# Patient Record
Sex: Male | Born: 1970 | Race: White | Hispanic: No | Marital: Married | Smoking: Current every day smoker
Health system: Southern US, Community
[De-identification: ages and names within clinical notes are randomized; demographics above are authoritative.]

## PROBLEM LIST (undated history)

## (undated) DIAGNOSIS — I10 Essential (primary) hypertension: Secondary | ICD-10-CM

## (undated) HISTORY — DX: Essential (primary) hypertension: I10

---

## 1998-02-06 ENCOUNTER — Ambulatory Visit (HOSPITAL_COMMUNITY): Admission: RE | Admit: 1998-02-06 | Discharge: 1998-02-06 | Payer: Self-pay | Admitting: Neurosurgery

## 1998-02-23 ENCOUNTER — Ambulatory Visit (HOSPITAL_COMMUNITY): Admission: RE | Admit: 1998-02-23 | Discharge: 1998-02-23 | Payer: Self-pay | Admitting: Neurosurgery

## 1999-06-10 ENCOUNTER — Encounter: Payer: Self-pay | Admitting: Neurosurgery

## 1999-06-10 ENCOUNTER — Ambulatory Visit (HOSPITAL_COMMUNITY): Admission: RE | Admit: 1999-06-10 | Discharge: 1999-06-10 | Payer: Self-pay | Admitting: Neurosurgery

## 1999-06-22 ENCOUNTER — Encounter: Payer: Self-pay | Admitting: Neurosurgery

## 1999-06-22 ENCOUNTER — Ambulatory Visit (HOSPITAL_COMMUNITY): Admission: RE | Admit: 1999-06-22 | Discharge: 1999-06-22 | Payer: Self-pay | Admitting: Neurosurgery

## 2000-10-06 ENCOUNTER — Ambulatory Visit (HOSPITAL_COMMUNITY): Admission: RE | Admit: 2000-10-06 | Discharge: 2000-10-06 | Payer: Self-pay | Admitting: Neurosurgery

## 2000-10-06 ENCOUNTER — Encounter: Payer: Self-pay | Admitting: Neurosurgery

## 2000-10-20 ENCOUNTER — Ambulatory Visit (HOSPITAL_COMMUNITY): Admission: RE | Admit: 2000-10-20 | Discharge: 2000-10-20 | Payer: Self-pay | Admitting: Neurosurgery

## 2000-10-20 ENCOUNTER — Encounter: Payer: Self-pay | Admitting: Neurosurgery

## 2000-12-01 ENCOUNTER — Ambulatory Visit (HOSPITAL_COMMUNITY): Admission: RE | Admit: 2000-12-01 | Discharge: 2000-12-01 | Payer: Self-pay | Admitting: Neurosurgery

## 2000-12-01 ENCOUNTER — Encounter: Payer: Self-pay | Admitting: Neurosurgery

## 2002-05-16 ENCOUNTER — Encounter: Admission: RE | Admit: 2002-05-16 | Discharge: 2002-05-16 | Payer: Self-pay | Admitting: Neurosurgery

## 2002-05-16 ENCOUNTER — Encounter: Payer: Self-pay | Admitting: Neurosurgery

## 2002-06-10 ENCOUNTER — Encounter: Payer: Self-pay | Admitting: Neurosurgery

## 2002-06-10 ENCOUNTER — Encounter: Admission: RE | Admit: 2002-06-10 | Discharge: 2002-06-10 | Payer: Self-pay | Admitting: Neurosurgery

## 2002-06-25 ENCOUNTER — Encounter: Payer: Self-pay | Admitting: Neurosurgery

## 2002-06-25 ENCOUNTER — Encounter: Admission: RE | Admit: 2002-06-25 | Discharge: 2002-06-25 | Payer: Self-pay | Admitting: Neurosurgery

## 2003-04-18 ENCOUNTER — Encounter: Admission: RE | Admit: 2003-04-18 | Discharge: 2003-04-18 | Payer: Self-pay | Admitting: Neurosurgery

## 2003-04-18 ENCOUNTER — Encounter: Payer: Self-pay | Admitting: Neurosurgery

## 2003-05-30 ENCOUNTER — Encounter: Payer: Self-pay | Admitting: Diagnostic Radiology

## 2003-05-30 ENCOUNTER — Encounter: Payer: Self-pay | Admitting: Neurosurgery

## 2003-05-30 ENCOUNTER — Encounter: Admission: RE | Admit: 2003-05-30 | Discharge: 2003-05-30 | Payer: Self-pay | Admitting: Neurosurgery

## 2003-06-16 ENCOUNTER — Encounter: Admission: RE | Admit: 2003-06-16 | Discharge: 2003-06-16 | Payer: Self-pay | Admitting: Neurosurgery

## 2003-06-16 ENCOUNTER — Encounter: Payer: Self-pay | Admitting: Neurosurgery

## 2003-10-02 ENCOUNTER — Encounter: Admission: RE | Admit: 2003-10-02 | Discharge: 2003-10-02 | Payer: Self-pay | Admitting: Neurosurgery

## 2003-10-15 ENCOUNTER — Encounter: Admission: RE | Admit: 2003-10-15 | Discharge: 2003-10-15 | Payer: Self-pay | Admitting: Neurosurgery

## 2004-04-30 ENCOUNTER — Encounter: Admission: RE | Admit: 2004-04-30 | Discharge: 2004-04-30 | Payer: Self-pay | Admitting: Neurosurgery

## 2004-05-20 ENCOUNTER — Encounter: Admission: RE | Admit: 2004-05-20 | Discharge: 2004-05-20 | Payer: Self-pay | Admitting: Neurosurgery

## 2004-06-07 ENCOUNTER — Encounter: Admission: RE | Admit: 2004-06-07 | Discharge: 2004-06-07 | Payer: Self-pay | Admitting: Neurosurgery

## 2004-07-22 ENCOUNTER — Encounter: Admission: RE | Admit: 2004-07-22 | Discharge: 2004-07-22 | Payer: Self-pay | Admitting: Family Medicine

## 2006-05-29 ENCOUNTER — Encounter: Admission: RE | Admit: 2006-05-29 | Discharge: 2006-05-29 | Payer: Self-pay | Admitting: Neurosurgery

## 2007-12-21 ENCOUNTER — Encounter: Admission: RE | Admit: 2007-12-21 | Discharge: 2007-12-21 | Payer: Self-pay | Admitting: Family Medicine

## 2007-12-26 ENCOUNTER — Ambulatory Visit (HOSPITAL_COMMUNITY): Admission: RE | Admit: 2007-12-26 | Discharge: 2007-12-26 | Payer: Self-pay | Admitting: Family Medicine

## 2008-01-11 ENCOUNTER — Ambulatory Visit: Payer: Self-pay | Admitting: Vascular Surgery

## 2009-12-20 ENCOUNTER — Encounter: Admission: RE | Admit: 2009-12-20 | Discharge: 2009-12-20 | Payer: Self-pay | Admitting: Neurosurgery

## 2010-03-05 ENCOUNTER — Encounter: Admission: RE | Admit: 2010-03-05 | Discharge: 2010-03-05 | Payer: Self-pay | Admitting: Neurosurgery

## 2010-03-12 ENCOUNTER — Inpatient Hospital Stay (HOSPITAL_COMMUNITY): Admission: RE | Admit: 2010-03-12 | Discharge: 2010-03-13 | Payer: Self-pay | Admitting: Neurosurgery

## 2010-11-21 ENCOUNTER — Encounter: Payer: Self-pay | Admitting: Neurosurgery

## 2011-01-18 LAB — COMPREHENSIVE METABOLIC PANEL
Alkaline Phosphatase: 52 U/L (ref 39–117)
BUN: 14 mg/dL (ref 6–23)
CO2: 26 mEq/L (ref 19–32)
Calcium: 9.4 mg/dL (ref 8.4–10.5)
Chloride: 107 mEq/L (ref 96–112)
Creatinine, Ser: 1.22 mg/dL (ref 0.4–1.5)
Sodium: 139 mEq/L (ref 135–145)
Total Protein: 6.6 g/dL (ref 6.0–8.3)

## 2011-01-18 LAB — URINALYSIS, ROUTINE W REFLEX MICROSCOPIC
Leukocytes, UA: NEGATIVE
Nitrite: NEGATIVE
Protein, ur: NEGATIVE mg/dL
Specific Gravity, Urine: 1.034 — ABNORMAL HIGH (ref 1.005–1.030)
Urobilinogen, UA: 1 mg/dL (ref 0.0–1.0)

## 2011-01-18 LAB — DIFFERENTIAL
Basophils Absolute: 0 10*3/uL (ref 0.0–0.1)
Basophils Relative: 0 % (ref 0–1)
Lymphocytes Relative: 27 % (ref 12–46)
Lymphs Abs: 2.2 10*3/uL (ref 0.7–4.0)
Monocytes Absolute: 0.6 10*3/uL (ref 0.1–1.0)
Neutro Abs: 5.1 10*3/uL (ref 1.7–7.7)

## 2011-01-18 LAB — CBC
HCT: 42.1 % (ref 39.0–52.0)
MCHC: 34.7 g/dL (ref 30.0–36.0)
RDW: 13.2 % (ref 11.5–15.5)

## 2011-01-18 LAB — URINE MICROSCOPIC-ADD ON

## 2011-01-18 LAB — SURGICAL PCR SCREEN: MRSA, PCR: NEGATIVE

## 2011-01-18 LAB — PROTIME-INR: INR: 0.97 (ref 0.00–1.49)

## 2011-01-18 LAB — APTT: aPTT: 28 seconds (ref 24–37)

## 2011-03-15 NOTE — Consult Note (Signed)
NEW PATIENT CONSULTATION   Anthony Cowan, Anthony Cowan  DOB:  05-Feb-1971                                       01/11/2008  XLKGM#:[id number]   HISTORY:  The patient is a 41 year old healthy gentleman who noted right  upper arm swelling over the past several weeks.  He is right-handed and  works as a Nutritional therapist.  He underwent initially an ultrasound for evaluation  of this which showed dampened flow in the subclavian vein.  Therefore  underwent a venogram on December 26, 2007, which I have reviewed.  His  venogram shows significant narrowing at the thoracic outlet.  There was  no evidence of thrombus.  There was an attempted angioplasty of this and  this did appear to be a fixed lesion with poor result from angioplasty.  The patient does not have any specific complaints of what would be  considered neurogenic thoracic outlet syndrome.  Specifically no  weakness or paresthesias.  He denies any prior trauma to this.   PAST MEDICAL HISTORY:  His past medical history is significant only for  elevated cholesterol.   FAMILY HISTORY:  Family history is for a heart attack in a brother at  age 54.  Sister died at age 72 with acute cardiomyopathy.   SOCIAL HISTORY:  He is married with 2 children.  He does work as a  Warehouse manager.  He does not smoke, having quit in 1996.  He does  not drink alcohol on a regular basis.  His weight is 220 pounds and he  is 6 feet tall.   REVIEW OF SYSTEMS:  Otherwise totally unremarkable.   ALLERGIES:  None.   MEDICATIONS:  1. Aspirin 81 mg daily.  2. Hydrocodone for pain as needed.   PHYSICAL EXAMINATION:  A well-developed, well-nourished, white male  appearing stated age.  He does not have any appreciable swelling in his  right versus left arm today.  On raising his arm above his head he does  lose his right radial pulse, does not lose his left radial pulse.   I had a long discussion with the patient and his wife present.  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  I  discussed the options from observation only to first rib resection and  subsequent venous angioplasty versus first rib resection and primary  venous repair.  They will discuss this further.  Currently he will  remain on his aspirin therapy.   Larina Earthly, M.D.  Electronically Signed   TFE/MEDQ  D:  01/14/2008  T:  01/14/2008  Job:  [id number]

## 2012-10-31 HISTORY — PX: CERVICAL DISC SURGERY: SHX588

## 2014-07-17 ENCOUNTER — Other Ambulatory Visit: Payer: Self-pay | Admitting: Family Medicine

## 2014-07-17 DIAGNOSIS — M542 Cervicalgia: Secondary | ICD-10-CM

## 2014-07-29 ENCOUNTER — Other Ambulatory Visit: Payer: Self-pay

## 2014-11-07 ENCOUNTER — Other Ambulatory Visit: Payer: Self-pay | Admitting: Neurosurgery

## 2014-11-07 DIAGNOSIS — M47812 Spondylosis without myelopathy or radiculopathy, cervical region: Secondary | ICD-10-CM

## 2014-11-10 ENCOUNTER — Ambulatory Visit
Admission: RE | Admit: 2014-11-10 | Discharge: 2014-11-10 | Disposition: A | Discharge status: Home / Self Care | Payer: PRIVATE HEALTH INSURANCE | Source: Ambulatory Visit | Attending: Neurosurgery | Admitting: Neurosurgery

## 2014-11-10 ENCOUNTER — Other Ambulatory Visit: Payer: Self-pay

## 2014-11-10 DIAGNOSIS — M47812 Spondylosis without myelopathy or radiculopathy, cervical region: Secondary | ICD-10-CM

## 2014-11-11 ENCOUNTER — Other Ambulatory Visit: Payer: Self-pay

## 2016-07-21 IMAGING — MR MR CERVICAL SPINE W/O CM
5 series · 30 of 48 positions shown · non-contrast
Comparison: 03/04/2010 lateral plain film exam of the cervical
spine. 12/20/2009 preoperative MR.

CLINICAL DATA: 43-year-old male with chronic neck pain. No injury.
Fusion 6433. Improvement after surgery but always has neck pain.
Evaluate for adjacent disease. Initial encounter.

EXAM:
MRI CERVICAL SPINE WITHOUT CONTRAST
TECHNIQUE: Multiplanar, multisequence MR imaging of the cervical spine was
performed. No intravenous contrast was administered.

[Series 3: T2 · sagittal · 3.0mm · 0.66mm/px · 6 of 12 slices shown (1 of 2)]
[im 1/12]
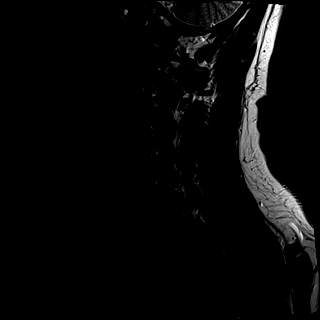
[im 3/12]
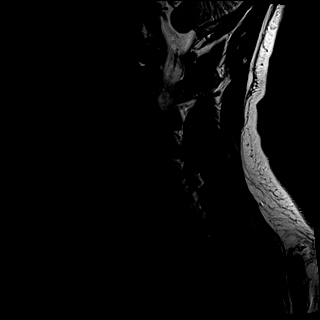
[im 5/12]
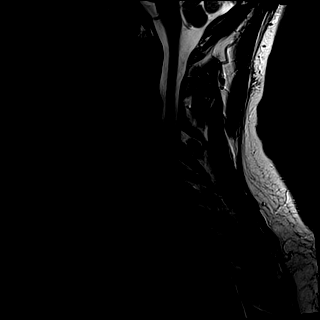
[im 7/12]
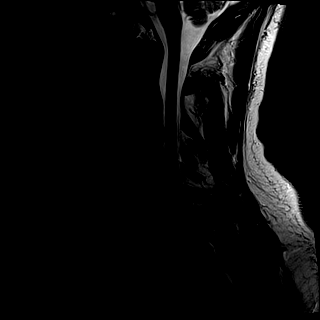
[im 9/12]
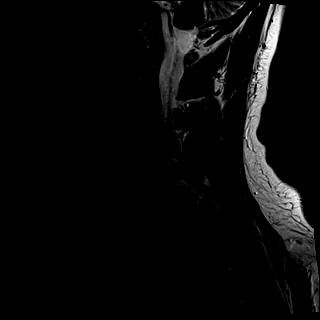
[im 12/12]
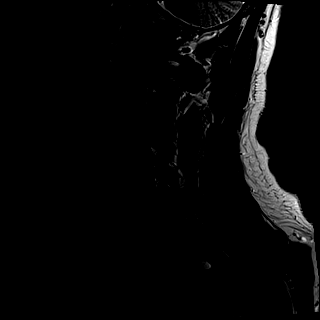

[Series 4: T1 · sagittal · 3.0mm · 0.66mm/px · 6 of 12 slices shown]
[im 1/12]
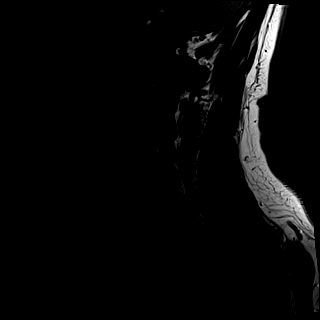
[im 3/12]
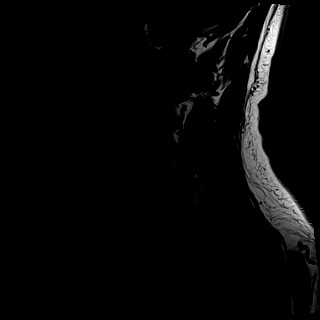
[im 5/12]
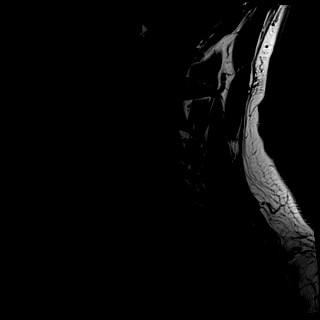
[im 7/12]
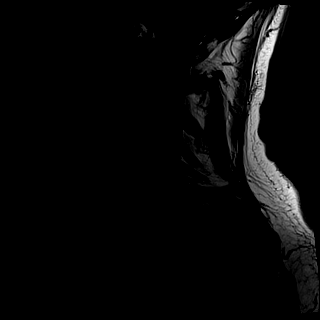
[im 9/12]
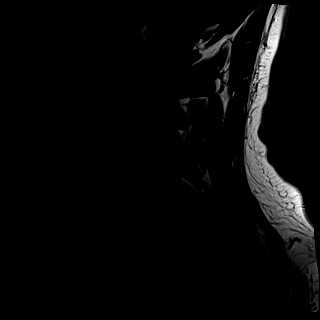
[im 12/12]
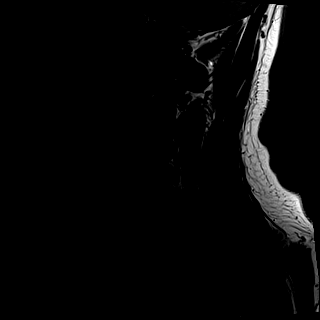

[Series 5: T2 · axial · 3.0mm · 0.56mm/px · z∈[-82,+34]mm · 9 of 32 slices shown (2 of 2)]
[im 1/32]
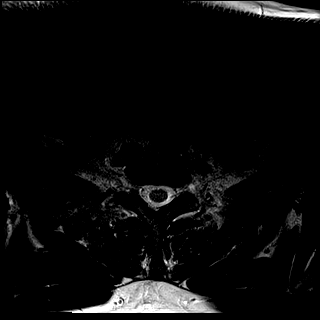
[im 5/32]
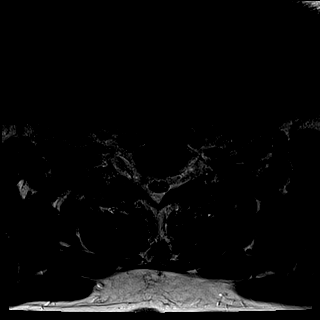
[im 9/32]
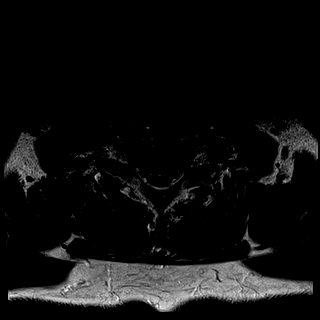
[im 14/32]
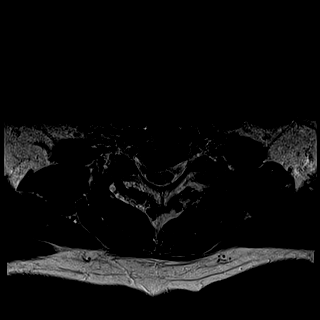
[im 16/32]
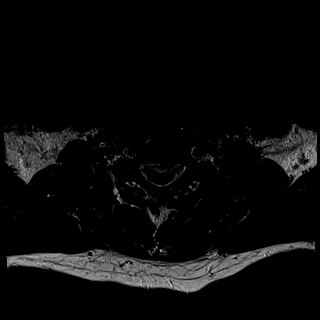
[im 18/32]
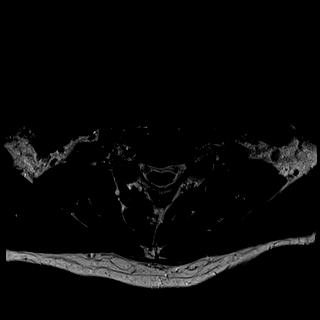
[im 23/32]
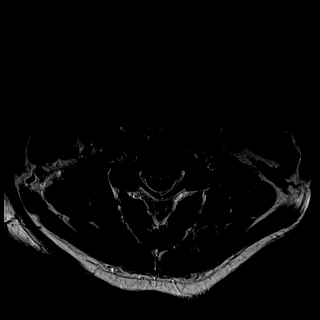
[im 27/32]
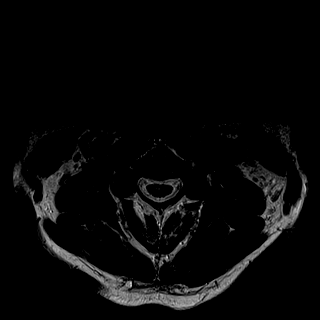
[im 32/32]
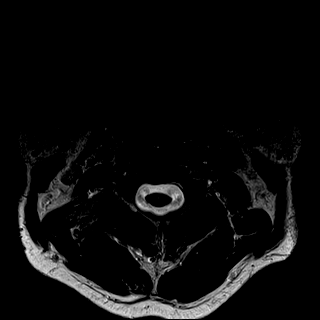

[Series 6: tir sag · sagittal · 3.0mm · 0.41mm/px · 6 of 12 slices shown]
[im 1/12]
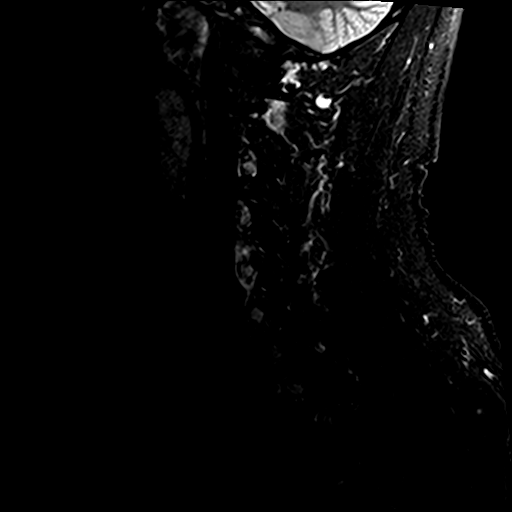
[im 3/12]
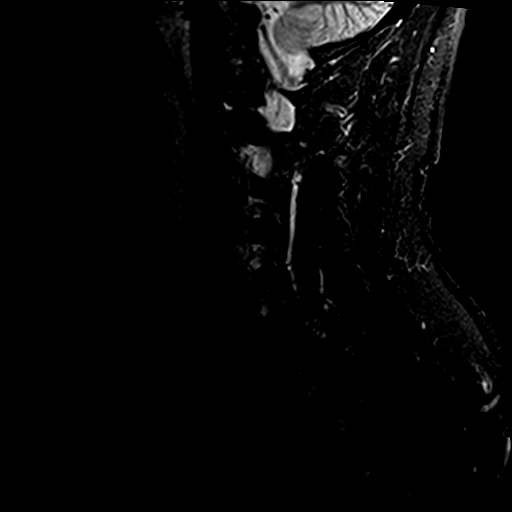
[im 5/12]
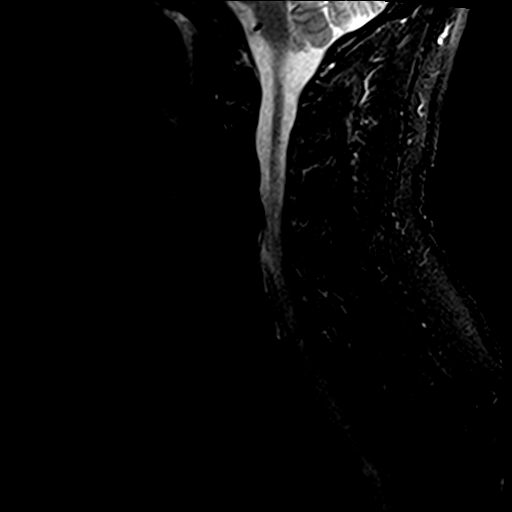
[im 7/12]
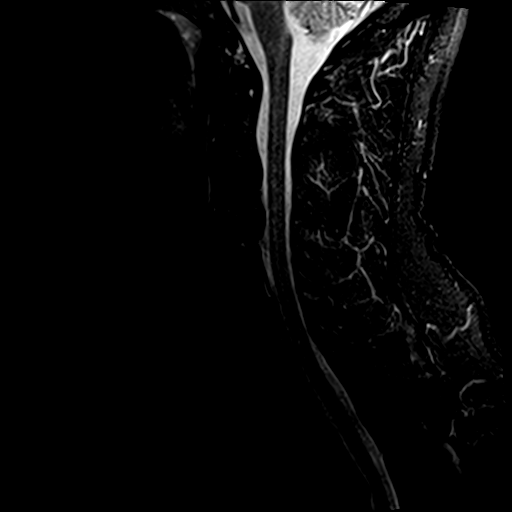
[im 9/12]
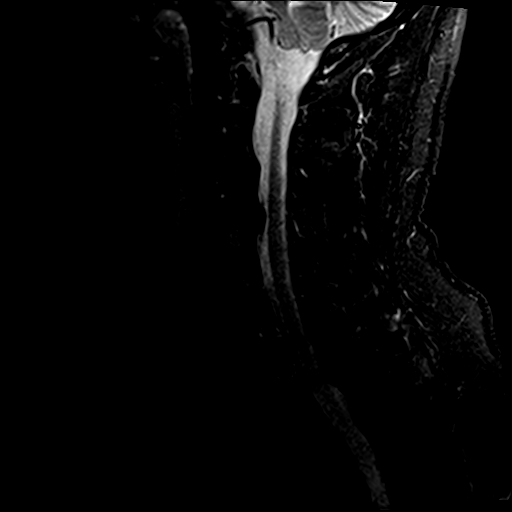
[im 12/12]
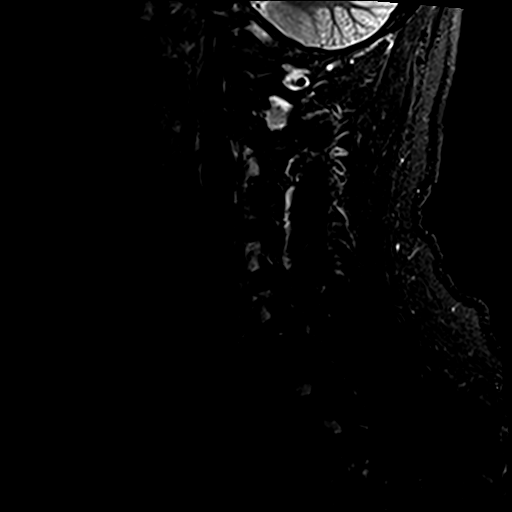

[Series 7: GRE · axial · 3.0mm · 0.35mm/px · z∈[-82,-52]mm · 3 of 32 slices shown]
[im 1/32]
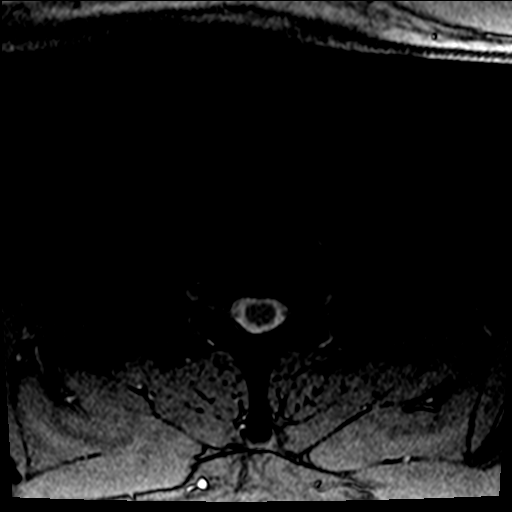
[im 5/32]
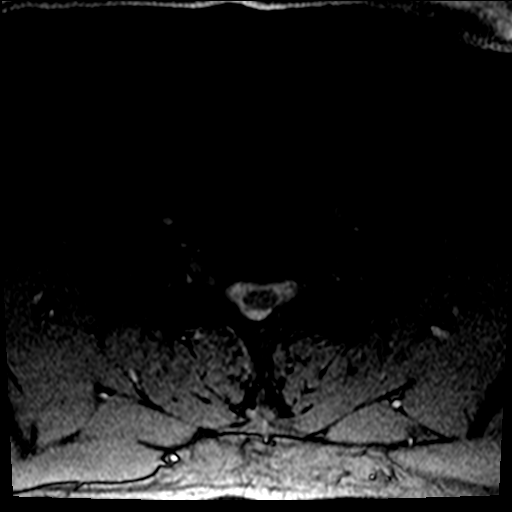
[im 9/32]
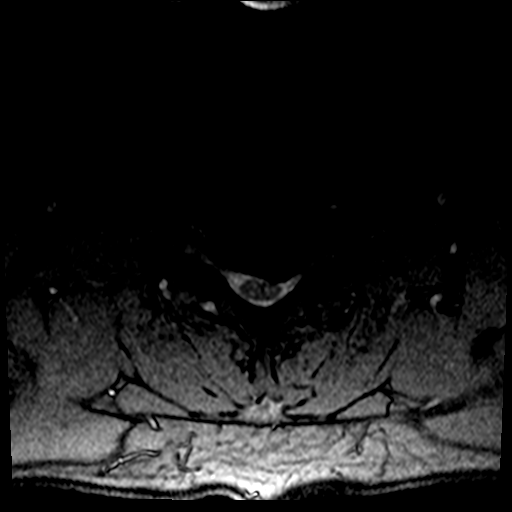

[30 of 48 positions shown; findings below may reference images not displayed]

FINDINGS: Cervical medullary junction and visualized intracranial structures
unremarkable. No focal cervical cord signal abnormality. Visualized
paravertebral structures within normal limits.

C2-3:  Negative.

C3-4: Small Schmorl's node deformity inferior endplate C3. Mild
bulge/ osteophyte. Narrowing of the ventral aspect of the thecal sac
without cord compression. Uncinate hypertrophy. Mild bilateral
foraminal narrowing.

C4-5:  Prior fusion.  Small spur right paracentral position.

C5-6: Prior fusion. Spur greater to the right. Impression upon the
ventral aspect of the thecal sac greater on right with minimal
right-sided cord contact/ flattening.

C6-7: Prior fusion. Osteophyte with greatest extension left
paracentral/posterior lateral position. Impression upon the ventral
aspect of the thecal sac greater on left with mild left-sided cord
contact/ flattening. Uncinate hypertrophy with moderate right-sided
and moderate to marked left-sided foraminal narrowing.

C7-T1: Facet joint degenerative changes greater on the left. Minimal
impression left posterior lateral aspect of the thecal sac.

T1-2: Mild posterior ligament prominence with mild narrowing dorsal
aspect of thecal sac. Axial images not obtained.

T2-3: Bulge greatest centrally with narrowing of the ventral aspect
of the thecal sac and mild cord flattening. This has progressed
minimally since prior exam. Axial images not obtained.
IMPRESSION: Since the prior MR, interval anterior discectomy/fusion C4 through
C7.

Summary of pertinent findings includes:

C3-4 mild bulge/ osteophyte. Narrowing of the ventral aspect of the
thecal sac. Mild bilateral foraminal narrowing.

C4-5 prior fusion. Small spur right paracentral position.

C5-6 prior fusion. Spur greater to the right. Impression upon the
ventral aspect of the thecal sac greater on right with minimal
right-sided cord contact/ flattening.

C6-7 prior fusion. Osteophyte with greatest extension left
paracentral/posterior lateral position. Impression upon the ventral
aspect of the thecal sac greater on left with mild left-sided cord
contact/ flattening. Moderate right-sided and moderate to marked
left-sided foraminal narrowing.

C7-T1 facet joint degenerative changes greater on the left. Minimal
impression left posterior lateral aspect of the thecal sac.

T1-2 mild posterior ligament prominence with mild narrowing dorsal
aspect of thecal sac. Axial images not obtained.

T2-3 bulge greatest centrally with narrowing of the ventral aspect
of the thecal sac and mild cord flattening. This has progressed
minimally since prior exam. Axial images not obtained.

## 2017-09-12 DIAGNOSIS — M47812 Spondylosis without myelopathy or radiculopathy, cervical region: Secondary | ICD-10-CM

## 2017-09-12 DIAGNOSIS — I10 Essential (primary) hypertension: Secondary | ICD-10-CM | POA: Insufficient documentation

## 2017-09-12 HISTORY — DX: Spondylosis without myelopathy or radiculopathy, cervical region: M47.812

## 2019-09-17 ENCOUNTER — Other Ambulatory Visit: Payer: Self-pay

## 2019-09-17 ENCOUNTER — Encounter: Payer: Self-pay | Admitting: Cardiology

## 2019-09-17 ENCOUNTER — Ambulatory Visit (INDEPENDENT_AMBULATORY_CARE_PROVIDER_SITE_OTHER): Payer: PRIVATE HEALTH INSURANCE | Admitting: Cardiology

## 2019-09-17 VITALS — BP 176/98 | HR 76 | Ht 72.0 in | Wt 214.0 lb

## 2019-09-17 DIAGNOSIS — I10 Essential (primary) hypertension: Secondary | ICD-10-CM | POA: Diagnosis not present

## 2019-09-17 MED ORDER — LISINOPRIL 20 MG PO TABS: 20.0000 mg | ORAL_TABLET | Freq: Every day | ORAL | 1 refills | Status: DC

## 2019-09-17 MED ORDER — AMLODIPINE BESYLATE 10 MG PO TABS: 10.0000 mg | ORAL_TABLET | Freq: Every day | ORAL | 1 refills | Status: DC

## 2019-09-17 NOTE — Progress Notes (Signed)
Cardiology Office Note:    Date:  09/17/2019   ID:  Anthony Cowan, DOB Aug 22, 1971, MRN [id number]  PCP:  No primary care provider on file.  Cardiologist:  No primary care provider on file.  Electrophysiologist:  None   Referring MD: No ref. provider found   Chief Complaint  Patient presents with  . New Patient (Initial Visit)   History of Present Illness:    Anthony Cowan is a 48 y.o. male with a hx of hypertension diagnosed several years ago he has been taking lisinopril 20 mg daily along with amlodipine 10 mg daily.  He is here today to establish cardiac care.  He denies any chest pain, shortness of breath, nausea, vomiting, any dizziness or nausea.  Past Medical History:  Diagnosis Date  . Hypertension     Past Surgical History:  Procedure Laterality Date  . CERVICAL DISC SURGERY  2014   C 1-3 Fusion    Current Medications: Current Meds  Medication Sig  . HYDROcodone-acetaminophen (NORCO/VICODIN) 5-325 MG tablet Take by mouth.     Allergies:   Patient has no known allergies.   Social History   Socioeconomic History  . Marital status: Married    Spouse name: Not on file  . Number of children: Not on file  . Years of education: Not on file  . Highest education level: Not on file  Occupational History  . Not on file  Social Needs  . Financial resource strain: Not on file  . Food insecurity    Worry: Not on file    Inability: Not on file  . Transportation needs    Medical: Not on file    Non-medical: Not on file  Tobacco Use  . Smoking status: Current Every Day Smoker    Types: Cigars  . Smokeless tobacco: Never Used  . Tobacco comment: 3 cigars/day  Substance and Sexual Activity  . Alcohol use: Not on file  . Drug use: Not on file  . Sexual activity: Not on file  Lifestyle  . Physical activity    Days per week: Not on file    Minutes per session: Not on file  . Stress: Not on file  Relationships  . Social Herbalist on phone: Not on  file    Gets together: Not on file    Attends religious service: Not on file    Active member of club or organization: Not on file    Attends meetings of clubs or organizations: Not on file    Relationship status: Not on file  Other Topics Concern  . Not on file  Social History Narrative  . Not on file     Family History: The patient's family history includes Heart attack in his sister; Hypertension in his brother and mother.  ROS:   Review of Systems  Constitution: Negative for decreased appetite, fever and weight gain.  HENT: Negative for congestion, ear discharge, hoarse voice and sore throat.   Eyes: Negative for discharge, redness, vision loss in right eye and visual halos.  Cardiovascular: Negative for chest pain, dyspnea on exertion, leg swelling, orthopnea and palpitations.  Respiratory: Negative for cough, hemoptysis, shortness of breath and snoring.   Endocrine: Negative for heat intolerance and polyphagia.  Hematologic/Lymphatic: Negative for bleeding problem. Does not bruise/bleed easily.  Skin: Negative for flushing, nail changes, rash and suspicious lesions.  Musculoskeletal: Negative for arthritis, joint pain, muscle cramps, myalgias, neck pain and stiffness.  Gastrointestinal: Negative for abdominal  pain, bowel incontinence, diarrhea and excessive appetite.  Genitourinary: Negative for decreased libido, genital sores and incomplete emptying.  Neurological: Negative for brief paralysis, focal weakness, headaches and loss of balance.  Psychiatric/Behavioral: Negative for altered mental status, depression and suicidal ideas.  Allergic/Immunologic: Negative for HIV exposure and persistent infections.    EKGs/Labs/Other Studies Reviewed:    The following studies were reviewed today:   EKG:  The ekg ordered today demonstrates sinus rhythm, heart rate 74 bpm, no prior EKG to compare.  Recent Labs: No results found for requested labs within last 8760 hours.  Recent  Lipid Panel No results found for: CHOL, TRIG, HDL, CHOLHDL, VLDL, LDLCALC, LDLDIRECT  Physical Exam:    VS:  BP (!) 176/98 (BP Location: Left Arm, Patient Position: Sitting)   Pulse 76   Ht 6' (1.829 m)   Wt 214 lb (97.1 kg)   SpO2 96%   BMI 29.02 kg/m     Wt Readings from Last 3 Encounters:  09/17/19 214 lb (97.1 kg)     GEN: Well nourished, well developed in no acute distress HEENT: Normal NECK: No JVD; No carotid bruits LYMPHATICS: No lymphadenopathy CARDIAC: S1S2 noted,RRR, no murmurs, rubs, gallops RESPIRATORY:  Clear to auscultation without rales, wheezing or rhonchi  ABDOMEN: Soft, non-tender, non-distended, +bowel sounds, no guarding. EXTREMITIES: No edema, No cyanosis, no clubbing MUSCULOSKELETAL:  No edema; No deformity  SKIN: Warm and dry NEUROLOGIC:  Alert and oriented x 3, non-focal PSYCHIATRIC:  Normal affect, good insight  ASSESSMENT:    No diagnosis found. PLAN:    1.  He is hypertensive today in the office however he had not been able to take his lisinopril 20 mg daily and amlodipine 10 mg daily due to him running out and his previous physician will not refill these medications.  I will restart him on his lisinopril 20 mg daily, amlodipine 10 mg daily.  I have also educated patient on decreasing salt intake.  In addition he is going to be getting the pressure cuff where he will take his blood pressure daily as this will help me understand if his current dose is meeting a target of 130/80 mmHg.  A transthoracic echocardiogram has been ordered today to assess any RV/LV function and any structural abnormalities.  2.  Blood work will be performed today.  Includes magnesium, BMP and lipid profile.  The patient is in agreement with the above plan. The patient left the office in stable condition.  The patient will follow up in 3 months   Medication Adjustments/Labs and Tests Ordered: Current medicines are reviewed at length with the patient today.  Concerns  regarding medicines are outlined above.  No orders of the defined types were placed in this encounter.  No orders of the defined types were placed in this encounter.   There are no Patient Instructions on file for this visit.   Adopting a Healthy Lifestyle.  Know what a healthy weight is for you (roughly BMI <25) and aim to maintain this   Aim for 7+ servings of fruits and vegetables daily   65-80+ fluid ounces of water or unsweet tea for healthy kidneys   Limit to max 1 drink of alcohol per day; avoid smoking/tobacco   Limit animal fats in diet for cholesterol and heart health - choose grass fed whenever available   Avoid highly processed foods, and foods high in saturated/trans fats   Aim for low stress - take time to unwind and care for your mental health  Aim for 150 min of moderate intensity exercise weekly for heart health, and weights twice weekly for bone health   Aim for 7-9 hours of sleep daily   When it comes to diets, agreement about the perfect plan isnt easy to find, even among the experts. Experts at the [facility] [employer] developed an idea known as the Healthy Eating Plate. Just imagine a plate divided into logical, healthy portions.   The emphasis is on diet quality:   Load up on vegetables and fruits - one-half of your plate: Aim for color and variety, and remember that potatoes dont count.   Go for whole grains - one-quarter of your plate: Whole wheat, barley, wheat berries, quinoa, oats, brown rice, and foods made with them. If you want pasta, go with whole wheat pasta.   Protein power - one-quarter of your plate: Fish, chicken, beans, and nuts are all healthy, versatile protein sources. Limit red meat.   The diet, however, does go beyond the plate, offering a few other suggestions.   Use healthy plant oils, such as olive, canola, soy, corn, sunflower and peanut. Check the labels, and avoid partially hydrogenated oil, which have unhealthy  trans fats.   If youre thirsty, drink water. Coffee and tea are good in moderation, but skip sugary drinks and limit milk and dairy products to one or two daily servings.   The type of carbohydrate in the diet is more important than the amount. Some sources of carbohydrates, such as vegetables, fruits, whole grains, and beans-are healthier than others.   Finally, stay active  Signed, Thomasene RippleKardie Samreet Edenfield, DO  09/17/2019 9:38 AM    [facility]

## 2019-09-17 NOTE — Patient Instructions (Addendum)
Medication Instructions:  Your physician has recommended you make the following change in your medication:   STArt Lisinopril 20 mg Take 1 tab daily START: Amlodipine 10 mg Take 1 tab daily   *If you need a refill on your cardiac medications before your next appointment, please call your pharmacy*  Lab Work: Your physician recommends that you return for lab work today: BMET Magnesium Lipids  If you have labs (blood work) drawn today and your tests are completely normal, you will receive your results only by: Marland Kitchen MyChart Message (if you have MyChart) OR . A paper copy in the mail If you have any lab test that is abnormal or we need to change your treatment, we will call you to review the results.  Testing/Procedures: Echocardiogram  Follow-Up: At [facility], you and your health needs are our priority.  As part of our continuing mission to provide you with exceptional heart care, we have created designated Provider Care Teams.  These Care Teams include your primary Cardiologist (physician) and Advanced Practice Providers (APPs -  Physician Assistants and Nurse Practitioners) who all work together to provide you with the care you need, when you need it.  Your next appointment:   3 months  The format for your next appointment:   In person  Provider:   Dr. Servando Salina  Other Instructions  Echocardiogram An echocardiogram is a procedure that uses painless sound waves (ultrasound) to produce an image of the heart. Images from an echocardiogram can provide important information about:  Signs of coronary artery disease (CAD).  Aneurysm detection. An aneurysm is a weak or damaged part of an artery wall that bulges out from the normal force of blood pumping through the body.  Heart size and shape. Changes in the size or shape of the heart can be associated with certain conditions, including heart failure, aneurysm, and CAD.  Heart muscle function.  Heart valve function.  Signs of a  past heart attack.  Fluid buildup around the heart.  Thickening of the heart muscle.  A tumor or infectious growth around the heart valves. Tell a health care provider about:  Any allergies you have.  All medicines you are taking, including vitamins, herbs, eye drops, creams, and over-the-counter medicines.  Any blood disorders you have.  Any surgeries you have had.  Any medical conditions you have.  Whether you are pregnant or may be pregnant. What are the risks? Generally, this is a safe procedure. However, problems may occur, including:  Allergic reaction to dye (contrast) that may be used during the procedure. What happens before the procedure? No specific preparation is needed. You may eat and drink normally. What happens during the procedure?   An IV tube may be inserted into one of your veins.  You may receive contrast through this tube. A contrast is an injection that improves the quality of the pictures from your heart.  A gel will be applied to your chest.  A wand-like tool (transducer) will be moved over your chest. The gel will help to transmit the sound waves from the transducer.  The sound waves will harmlessly bounce off of your heart to allow the heart images to be captured in real-time motion. The images will be recorded on a computer. The procedure may vary among health care providers and hospitals. What happens after the procedure?  You may return to your normal, everyday life, including diet, activities, and medicines, unless your health care provider tells you not to do that. Summary  An echocardiogram is a procedure that uses painless sound waves (ultrasound) to produce an image of the heart.  Images from an echocardiogram can provide important information about the size and shape of your heart, heart muscle function, heart valve function, and fluid buildup around your heart.  You do not need to do anything to prepare before this procedure. You  may eat and drink normally.  After the echocardiogram is completed, you may return to your normal, everyday life, unless your health care provider tells you not to do that. This information is not intended to replace advice given to you by your health care provider. Make sure you discuss any questions you have with your health care provider. Document Released: 10/14/2000 Document Revised: 02/07/2019 Document Reviewed: 11/19/2016 Elsevier Patient Education  2020 [employer].

## 2019-09-18 LAB — BASIC METABOLIC PANEL
BUN/Creatinine Ratio: 10 (ref 9–20)
BUN: 12 mg/dL (ref 6–24)
CO2: 24 mmol/L (ref 20–29)
Calcium: 9.7 mg/dL (ref 8.7–10.2)
Chloride: 104 mmol/L (ref 96–106)
Creatinine, Ser: 1.16 mg/dL (ref 0.76–1.27)
GFR calc Af Amer: 86 mL/min/{1.73_m2} (ref >59)
GFR calc non Af Amer: 74 mL/min/{1.73_m2} (ref >59)
Glucose: 92 mg/dL (ref 65–99)
Potassium: 4.7 mmol/L (ref 3.5–5.2)
Sodium: 142 mmol/L (ref 134–144)

## 2019-09-18 LAB — LIPID PANEL
Chol/HDL Ratio: 6.8 ratio — ABNORMAL HIGH (ref 0.0–5.0)
Cholesterol, Total: 177 mg/dL (ref 100–199)
HDL: 26 mg/dL — ABNORMAL LOW (ref >39)
LDL Chol Calc (NIH): 128 mg/dL — ABNORMAL HIGH (ref 0–99)
Triglycerides: 124 mg/dL (ref 0–149)
VLDL Cholesterol Cal: 23 mg/dL (ref 5–40)

## 2019-09-18 LAB — MAGNESIUM: Magnesium: 2.2 mg/dL (ref 1.6–2.3)

## 2019-09-19 ENCOUNTER — Telehealth: Payer: Self-pay | Admitting: *Deleted

## 2019-09-19 NOTE — Telephone Encounter (Signed)
Left message that labs were normal per DR tobb and to call with any questions

## 2019-09-19 NOTE — Telephone Encounter (Signed)
-----   Message from Berniece Salines, DO sent at 09/18/2019  9:17 PM EST ----- Normal labs

## 2019-11-04 ENCOUNTER — Other Ambulatory Visit: Payer: Self-pay

## 2019-11-04 ENCOUNTER — Ambulatory Visit (INDEPENDENT_AMBULATORY_CARE_PROVIDER_SITE_OTHER): Payer: Managed Care, Other (non HMO)

## 2019-11-04 DIAGNOSIS — I10 Essential (primary) hypertension: Secondary | ICD-10-CM

## 2019-11-04 NOTE — Progress Notes (Signed)
Complete echocardiogram has been performed.  Jimmy Deborrah Mabin RDCS, RVT 

## 2019-11-07 ENCOUNTER — Telehealth: Payer: Self-pay | Admitting: *Deleted

## 2019-11-07 NOTE — Telephone Encounter (Signed)
Telephone call to patient. Left message with echo results and to call with any questions. 

## 2019-11-07 NOTE — Telephone Encounter (Signed)
-----   Message from Thomasene Ripple, DO sent at 11/05/2019  7:20 PM EST ----- The echo showed that the heart is not fully relaxing like it should ( diastolic dysfunction) ,but otherwise normal. I will discuss it at the next office visit.

## 2019-11-12 ENCOUNTER — Other Ambulatory Visit: Payer: Self-pay | Admitting: Cardiology

## 2019-12-18 ENCOUNTER — Encounter: Payer: Self-pay | Admitting: Cardiology

## 2019-12-18 ENCOUNTER — Ambulatory Visit (INDEPENDENT_AMBULATORY_CARE_PROVIDER_SITE_OTHER): Payer: Managed Care, Other (non HMO) | Admitting: Cardiology

## 2019-12-18 ENCOUNTER — Other Ambulatory Visit: Payer: Self-pay

## 2019-12-18 VITALS — BP 152/102 | HR 79 | Ht 72.0 in | Wt 224.0 lb

## 2019-12-18 DIAGNOSIS — I10 Essential (primary) hypertension: Secondary | ICD-10-CM

## 2019-12-18 DIAGNOSIS — E785 Hyperlipidemia, unspecified: Secondary | ICD-10-CM

## 2019-12-18 DIAGNOSIS — E669 Obesity, unspecified: Secondary | ICD-10-CM

## 2019-12-18 HISTORY — DX: Hyperlipidemia, unspecified: E78.5

## 2019-12-18 HISTORY — DX: Obesity, unspecified: E66.9

## 2019-12-18 MED ORDER — LISINOPRIL 20 MG PO TABS: 20.0000 mg | ORAL_TABLET | Freq: Two times a day (BID) | ORAL | 1 refills | Status: DC

## 2019-12-18 NOTE — Progress Notes (Signed)
Cardiology Office Note:    Date:  12/18/2019   ID:  Nanda Quinton, DOB [birth year], MRN [id number]  PCP:  Patient, No Pcp Per  Cardiologist:  Thomasene Ripple, DO  Electrophysiologist:  None   Referring MD: No ref. provider found   Chief Complaint  Patient presents with  . Follow-up    History of Present Illness:    JONATHANDAVID MARLETT is a 49 y.o. male with a hx of hypertension presents today for follow-up visit.  I did see the patient for the first time on September 17, 2019 at that time he was prescribed lisinopril 20 mg daily and amlodipine 10 mg daily. At the time of his visit he was not taking his antihypertensive medication due to him running out.  At the conclusion of his visit I restarted his medications,asked the patient take his blood pressure daily and educated him about salt intake. A TTE was also ordered to assess LV wall thickness.  Patient is here today for follow-up visit.  No complaints at this time.   Past Medical History:  Diagnosis Date  . Hypertension     Past Surgical History:  Procedure Laterality Date  . CERVICAL DISC SURGERY  2014   C 1-3 Fusion    Current Medications: Current Meds  Medication Sig  . amLODipine (NORVASC) 10 MG tablet TAKE 1 TABLET(10 MG) BY MOUTH DAILY  . lisinopril (ZESTRIL) 20 MG tablet Take 1 tablet (20 mg total) by mouth 2 (two) times daily.  Marland Kitchen terbinafine (LAMISIL) 250 MG tablet Take 250 mg by mouth daily.  Marland Kitchen triamcinolone cream (KENALOG) 0.1 % APPLY TWICE DAILY TO THE AFFECTED AREA FOR 2 WEEKS AS NEEDED  . [DISCONTINUED] lisinopril (ZESTRIL) 20 MG tablet TAKE 1 TABLET(20 MG) BY MOUTH DAILY     Allergies:   Patient has no known allergies.   Social History   Socioeconomic History  . Marital status: Married    Spouse name: Not on file  . Number of children: Not on file  . Years of education: Not on file  . Highest education level: Not on file  Occupational History  . Not on file  Tobacco Use  . Smoking status: Current Every  Day Smoker    Types: Cigars  . Smokeless tobacco: Never Used  . Tobacco comment: 3 cigars/day  Substance and Sexual Activity  . Alcohol use: Not on file  . Drug use: Not on file  . Sexual activity: Not on file  Other Topics Concern  . Not on file  Social History Narrative  . Not on file   Social Determinants of Health   Financial Resource Strain:   . Difficulty of Paying Living Expenses: Not on file  Food Insecurity:   . Worried About Programme researcher, broadcasting/film/video in the Last Year: Not on file  . Ran Out of Food in the Last Year: Not on file  Transportation Needs:   . Lack of Transportation (Medical): Not on file  . Lack of Transportation (Non-Medical): Not on file  Physical Activity:   . Days of Exercise per Week: Not on file  . Minutes of Exercise per Session: Not on file  Stress:   . Feeling of Stress : Not on file  Social Connections:   . Frequency of Communication with Friends and Family: Not on file  . Frequency of Social Gatherings with Friends and Family: Not on file  . Attends Religious Services: Not on file  . Active Member of Clubs or Organizations: Not on  file  . Attends Banker Meetings: Not on file  . Marital Status: Not on file     Family History: The patient's family history includes Heart attack in his sister; Hypertension in his brother and mother.  ROS:   Review of Systems  Constitution: Negative for decreased appetite, fever and weight gain.  HENT: Negative for congestion, ear discharge, hoarse voice and sore throat.   Eyes: Negative for discharge, redness, vision loss in right eye and visual halos.  Cardiovascular: Negative for chest pain, dyspnea on exertion, leg swelling, orthopnea and palpitations.  Respiratory: Negative for cough, hemoptysis, shortness of breath and snoring.   Endocrine: Negative for heat intolerance and polyphagia.  Hematologic/Lymphatic: Negative for bleeding problem. Does not bruise/bleed easily.  Skin: Negative for  flushing, nail changes, rash and suspicious lesions.  Musculoskeletal: Negative for arthritis, joint pain, muscle cramps, myalgias, neck pain and stiffness.  Gastrointestinal: Negative for abdominal pain, bowel incontinence, diarrhea and excessive appetite.  Genitourinary: Negative for decreased libido, genital sores and incomplete emptying.  Neurological: Negative for brief paralysis, focal weakness, headaches and loss of balance.  Psychiatric/Behavioral: Negative for altered mental status, depression and suicidal ideas.  Allergic/Immunologic: Negative for HIV exposure and persistent infections.    EKGs/Labs/Other Studies Reviewed:    The following studies were reviewed today:   EKG:  The ekg ordered today demonstrates   Recent Labs: 09/17/2019: BUN 12; Creatinine, Ser 1.16; Magnesium 2.2; Potassium 4.7; Sodium 142  Recent Lipid Panel    Component Value Date/Time   CHOL 177 09/17/2019 0957   TRIG 124 09/17/2019 0957   HDL 26 (L) 09/17/2019 0957   CHOLHDL 6.8 (H) 09/17/2019 0957   LDLCALC 128 (H) 09/17/2019 0957    Physical Exam:    VS:  BP (!) 152/102 (BP Location: Right Arm, Patient Position: Sitting, Cuff Size: Normal)   Pulse 79   Ht 6' (1.829 m)   Wt 224 lb (101.6 kg)   SpO2 98%   BMI 30.38 kg/m     Wt Readings from Last 3 Encounters:  12/18/19 224 lb (101.6 kg)  09/17/19 214 lb (97.1 kg)     GEN: Well nourished, well developed in no acute distress HEENT: Normal NECK: No JVD; No carotid bruits LYMPHATICS: No lymphadenopathy CARDIAC: S1S2 noted,RRR, no murmurs, rubs, gallops RESPIRATORY:  Clear to auscultation without rales, wheezing or rhonchi  ABDOMEN: Soft, non-tender, non-distended, +bowel sounds, no guarding. EXTREMITIES: No edema, No cyanosis, no clubbing MUSCULOSKELETAL:  No deformity  SKIN: Warm and dry NEUROLOGIC:  Alert and oriented x 3, non-focal PSYCHIATRIC:  Normal affect, good insight  ASSESSMENT:    1. Essential hypertension   2. Obesity  (BMI 30-39.9)   3. Dyslipidemia (high LDL; low HDL)    PLAN:    Blood pressure is elevated today, he reports that he has been taking his antihypertensives, amlodipine 10 mg daily and lisinopril 20 mg daily.  I have discussed with the patient his target is less than 130/80 therefore I am going to increase his lisinopril today 20 mg daily to 20 mg twice daily.  Also explained to him that we will need to add patient antihypertensive medication if this regimen does not give him his target.  Presents understanding.  He has cut back on salt intake.  Also ask him to take his blood pressure daily) with him to his next office.   I was also able to speak to the patient about his echo report at this time he has no additional questions.  Blood work including  BMP and mag will be done prior to his next visit.  Dyslipidemia he prefers diet control for now.  The patient understands the need to lose weight with diet and exercise. We have discussed specific strategies for this.  The patient is in agreement with the above plan. The patient left the office in stable condition.  The patient will follow up in 1 month or sooner as needed.   Medication Adjustments/Labs and Tests Ordered: Current medicines are reviewed at length with the patient today.  Concerns regarding medicines are outlined above.  Orders Placed This Encounter  Procedures  . Basic Metabolic Panel (BMET)  . Magnesium   Meds ordered this encounter  Medications  . lisinopril (ZESTRIL) 20 MG tablet    Sig: Take 1 tablet (20 mg total) by mouth 2 (two) times daily.    Dispense:  180 tablet    Refill:  1    **Patient requests 90 days supply**    Patient Instructions  Medication Instructions:  Your physician has recommended you make the following change in your medication:   INCREASE: Lisinopril to 20 mg Take 1 tab twice daily  *If you need a refill on your cardiac medications before your next appointment, please call your  pharmacy*  Lab Work: Your physician recommends that you return for lab work in: Prior to next visit: Bmp ,Magnesium  If you have labs (blood work) drawn today and your tests are completely normal, you will receive your results only by: Marland Kitchen MyChart Message (if you have MyChart) OR . A paper copy in the mail If you have any lab test that is abnormal or we need to change your treatment, we will call you to review the results.  Testing/Procedures: NOne  Follow-Up: At [facility], you and your health needs are our priority.  As part of our continuing mission to provide you with exceptional heart care, we have created designated Provider Care Teams.  These Care Teams include your primary Cardiologist (physician) and Advanced Practice Providers (APPs -  Physician Assistants and Nurse Practitioners) who all work together to provide you with the care you need, when you need it.  Your next appointment:   1 month(s)  The format for your next appointment:   In Person  Provider:   Thomasene Ripple, DO  Other Instructions  Blood Pressure Record Sheet To take your blood pressure, you will need a blood pressure machine. You can buy a blood pressure machine (blood pressure monitor) at your clinic, drug store, or online. When choosing one, consider:  An automatic monitor that has an arm cuff.  A cuff that wraps snugly around your upper arm. You should be able to fit only one finger between your arm and the cuff.  A device that stores blood pressure reading results.  Do not choose a monitor that measures your blood pressure from your wrist or finger. Follow your health care provider's instructions for how to take your blood pressure. To use this form:  Get one reading in the morning (a.m.) before you take any medicines.  Get one reading in the evening (p.m.) before supper.  Take at least 2 readings with each blood pressure check. This makes sure the results are correct. Wait 1-2 minutes between  measurements.  Write down the results in the spaces on this form.  Repeat this once a week, or as told by your health care provider.  Make a follow-up appointment with your health care provider to discuss the  results. Blood pressure log Date: _______________________  a.m. _____________________(1st reading) _____________________(2nd reading)  p.m. _____________________(1st reading) _____________________(2nd reading) Date: _______________________  a.m. _____________________(1st reading) _____________________(2nd reading)  p.m. _____________________(1st reading) _____________________(2nd reading) Date: _______________________  a.m. _____________________(1st reading) _____________________(2nd reading)  p.m. _____________________(1st reading) _____________________(2nd reading) Date: _______________________  a.m. _____________________(1st reading) _____________________(2nd reading)  p.m. _____________________(1st reading) _____________________(2nd reading) Date: _______________________  a.m. _____________________(1st reading) _____________________(2nd reading)  p.m. _____________________(1st reading) _____________________(2nd reading) This information is not intended to replace advice given to you by your health care provider. Make sure you discuss any questions you have with your health care provider. Document Revised: 12/15/2017 Document Reviewed: 10/17/2017 Elsevier Patient Education  [place].     Adopting a Healthy Lifestyle.  Know what a healthy weight is for you (roughly BMI <25) and aim to maintain this   Aim for 7+ servings of fruits and vegetables daily   65-80+ fluid ounces of water or unsweet tea for healthy kidneys   Limit to max 1 drink of alcohol per day; avoid smoking/tobacco   Limit animal fats in diet for cholesterol and heart health - choose grass fed whenever available   Avoid highly processed foods, and foods high in saturated/trans fats     Aim for low stress - take time to unwind and care for your mental health   Aim for 150 min of moderate intensity exercise weekly for heart health, and weights twice weekly for bone health   Aim for 7-9 hours of sleep daily   When it comes to diets, agreement about the perfect plan isnt easy to find, even among the experts. Experts at the [employer] developed an idea known as the Healthy Eating Plate. Just imagine a plate divided into logical, healthy portions.   The emphasis is on diet quality:   Load up on vegetables and fruits - one-half of your plate: Aim for color and variety, and remember that potatoes dont count.   Go for whole grains - one-quarter of your plate: Whole wheat, barley, wheat berries, quinoa, oats, brown rice, and foods made with them. If you want pasta, go with whole wheat pasta.   Protein power - one-quarter of your plate: Fish, chicken, beans, and nuts are all healthy, versatile protein sources. Limit red meat.   The diet, however, does go beyond the plate, offering a few other suggestions.   Use healthy plant oils, such as olive, canola, soy, corn, sunflower and peanut. Check the labels, and avoid partially hydrogenated oil, which have unhealthy trans fats.   If youre thirsty, drink water. Coffee and tea are good in moderation, but skip sugary drinks and limit milk and dairy products to one or two daily servings.   The type of carbohydrate in the diet is more important than the amount. Some sources of carbohydrates, such as vegetables, fruits, whole grains, and beans-are healthier than others.   Finally, stay active  Signed, Berniece Salines, DO  12/18/2019 9:18 AM    [place]

## 2019-12-18 NOTE — Patient Instructions (Signed)
Medication Instructions:  Your physician has recommended you make the following change in your medication:   INCREASE: Lisinopril to 20 mg Take 1 tab twice daily  *If you need a refill on your cardiac medications before your next appointment, please call your pharmacy*  Lab Work: Your physician recommends that you return for lab work in: Prior to next visit: Bmp ,Magnesium  If you have labs (blood work) drawn today and your tests are completely normal, you will receive your results only by: Marland Kitchen MyChart Message (if you have MyChart) OR . A paper copy in the mail If you have any lab test that is abnormal or we need to change your treatment, we will call you to review the results.  Testing/Procedures: NOne  Follow-Up: At [facility], you and your health needs are our priority.  As part of our continuing mission to provide you with exceptional heart care, we have created designated Provider Care Teams.  These Care Teams include your primary Cardiologist (physician) and Advanced Practice Providers (APPs -  Physician Assistants and Nurse Practitioners) who all work together to provide you with the care you need, when you need it.  Your next appointment:   1 month(s)  The format for your next appointment:   In Person  Provider:   Thomasene Ripple, DO  Other Instructions  Blood Pressure Record Sheet To take your blood pressure, you will need a blood pressure machine. You can buy a blood pressure machine (blood pressure monitor) at your clinic, drug store, or online. When choosing one, consider:  An automatic monitor that has an arm cuff.  A cuff that wraps snugly around your upper arm. You should be able to fit only one finger between your arm and the cuff.  A device that stores blood pressure reading results.  Do not choose a monitor that measures your blood pressure from your wrist or finger. Follow your health care provider's instructions for how to take your blood pressure. To use  this form:  Get one reading in the morning (a.m.) before you take any medicines.  Get one reading in the evening (p.m.) before supper.  Take at least 2 readings with each blood pressure check. This makes sure the results are correct. Wait 1-2 minutes between measurements.  Write down the results in the spaces on this form.  Repeat this once a week, or as told by your health care provider.  Make a follow-up appointment with your health care provider to discuss the results. Blood pressure log Date: _______________________  a.m. _____________________(1st reading) _____________________(2nd reading)  p.m. _____________________(1st reading) _____________________(2nd reading) Date: _______________________  a.m. _____________________(1st reading) _____________________(2nd reading)  p.m. _____________________(1st reading) _____________________(2nd reading) Date: _______________________  a.m. _____________________(1st reading) _____________________(2nd reading)  p.m. _____________________(1st reading) _____________________(2nd reading) Date: _______________________  a.m. _____________________(1st reading) _____________________(2nd reading)  p.m. _____________________(1st reading) _____________________(2nd reading) Date: _______________________  a.m. _____________________(1st reading) _____________________(2nd reading)  p.m. _____________________(1st reading) _____________________(2nd reading) This information is not intended to replace advice given to you by your health care provider. Make sure you discuss any questions you have with your health care provider. Document Revised: 12/15/2017 Document Reviewed: 10/17/2017 Elsevier Patient Education  2020 [employer].

## 2020-01-15 ENCOUNTER — Ambulatory Visit: Payer: Managed Care, Other (non HMO) | Admitting: Cardiology

## 2020-03-16 ENCOUNTER — Other Ambulatory Visit: Payer: Self-pay | Admitting: Cardiology

## 2020-08-14 ENCOUNTER — Other Ambulatory Visit: Payer: Self-pay | Admitting: Cardiology

## 2020-08-14 NOTE — Telephone Encounter (Signed)
Rx refill sent to pharmacy. 

## 2020-10-24 ENCOUNTER — Other Ambulatory Visit: Payer: Self-pay | Admitting: Cardiology

## 2021-01-22 ENCOUNTER — Other Ambulatory Visit: Payer: Self-pay | Admitting: Cardiology

## 2021-01-22 NOTE — Telephone Encounter (Signed)
Amlodipine approved and sent with instruction to arrange an appt. Last OV 12/2019

## 2021-02-26 ENCOUNTER — Other Ambulatory Visit: Payer: Self-pay | Admitting: Cardiology

## 2021-02-26 NOTE — Telephone Encounter (Signed)
Refill sent to pharmacy.   Need appointment for further refills

## 2021-04-05 ENCOUNTER — Other Ambulatory Visit: Payer: Self-pay | Admitting: Cardiology

## 2021-04-05 NOTE — Telephone Encounter (Signed)
Rx approved and sent with instruction to arrange an appt with Dr. Servando Salina for further refills. 2nd attempt

## 2021-04-21 ENCOUNTER — Other Ambulatory Visit: Payer: Self-pay | Admitting: Cardiology

## 2021-05-10 ENCOUNTER — Other Ambulatory Visit: Payer: Self-pay | Admitting: Cardiology

## 2021-05-12 ENCOUNTER — Telehealth: Payer: Self-pay | Admitting: Cardiology

## 2021-05-12 MED ORDER — AMLODIPINE BESYLATE 10 MG PO TABS: 10.0000 mg | ORAL_TABLET | Freq: Every day | ORAL | 0 refills | Status: DC

## 2021-05-12 MED ORDER — LISINOPRIL 20 MG PO TABS: 20.0000 mg | ORAL_TABLET | Freq: Two times a day (BID) | ORAL | 0 refills | Status: DC

## 2021-05-12 NOTE — Telephone Encounter (Signed)
*  STAT* If patient is at the pharmacy, call can be transferred to refill team.   1. Which medications need to be refilled? (please list name of each medication and dose if known)  amLODipine (NORVASC) 10 MG tablet lisinopril (ZESTRIL) 20 MG tablet terbinafine (LAMISIL) 250 MG tablet triamcinolone cream (KENALOG) 0.1 %  2. Which pharmacy/location (including street and city if local pharmacy) is medication to be sent to? [id number] - [place], [place] - [address]  3. Do they need a 30 day or 90 day supply? 90 day supply  Has appt scheduled for 06/03/21.

## 2021-06-03 ENCOUNTER — Ambulatory Visit: Payer: Managed Care, Other (non HMO) | Admitting: Cardiology

## 2021-06-09 ENCOUNTER — Other Ambulatory Visit: Payer: Self-pay | Admitting: Cardiology

## 2021-06-16 ENCOUNTER — Encounter: Payer: Self-pay | Admitting: Cardiology

## 2021-06-16 ENCOUNTER — Other Ambulatory Visit: Payer: Self-pay

## 2021-06-16 ENCOUNTER — Ambulatory Visit: Payer: Managed Care, Other (non HMO) | Admitting: Cardiology

## 2021-06-16 VITALS — BP 142/92 | HR 81 | Ht 72.0 in | Wt 219.8 lb

## 2021-06-16 DIAGNOSIS — I10 Essential (primary) hypertension: Secondary | ICD-10-CM | POA: Diagnosis not present

## 2021-06-16 DIAGNOSIS — E785 Hyperlipidemia, unspecified: Secondary | ICD-10-CM | POA: Diagnosis not present

## 2021-06-16 LAB — BASIC METABOLIC PANEL
BUN/Creatinine Ratio: 15 (ref 9–20)
BUN: 18 mg/dL (ref 6–24)
CO2: 21 mmol/L (ref 20–29)
Calcium: 9.4 mg/dL (ref 8.7–10.2)
Chloride: 104 mmol/L (ref 96–106)
Creatinine, Ser: 1.17 mg/dL (ref 0.76–1.27)
Glucose: 90 mg/dL (ref 65–99)
Potassium: 4 mmol/L (ref 3.5–5.2)
Sodium: 139 mmol/L (ref 134–144)
eGFR: 76 mL/min/{1.73_m2} (ref >59)

## 2021-06-16 LAB — LIPID PANEL
Chol/HDL Ratio: 7.9 ratio — ABNORMAL HIGH (ref 0.0–5.0)
Cholesterol, Total: 190 mg/dL (ref 100–199)
HDL: 24 mg/dL — ABNORMAL LOW (ref >39)
LDL Chol Calc (NIH): 143 mg/dL — ABNORMAL HIGH (ref 0–99)
Triglycerides: 127 mg/dL (ref 0–149)
VLDL Cholesterol Cal: 23 mg/dL (ref 5–40)

## 2021-06-16 LAB — MAGNESIUM: Magnesium: 2.2 mg/dL (ref 1.6–2.3)

## 2021-06-16 MED ORDER — VALSARTAN 160 MG PO TABS
160.0000 mg | ORAL_TABLET | Freq: Every day | ORAL | 3 refills | Status: DC
Start: 2021-06-16 — End: 2022-06-16

## 2021-06-16 NOTE — Progress Notes (Signed)
Cardiology Office Note:    Date:  06/16/2021   ID:  Anthony Cowan, DOB [birth year], MRN [id number]  PCP:  Patient, No Pcp Per (Inactive)  Cardiologist:  Thomasene RippleKardie Lazarius Rivkin, DO  Electrophysiologist:  None   Referring MD: No ref. provider found   Chief Complaint  Patient presents with   Follow-up    History of Present Illness:    Anthony Cowan is a 50 y.o. male with a hx of hypertension, dyslipidemia patient prefers diet modification, obesity is here today for follow-up visit.  I last saw the patient on December 18, 2019 at that time he was hypertensive I increase his lisinopril to 20 mg twice a day.  And recommended the patient come back for follow-up visit in a month.  Unfortunately the patient was unable to follow-up up until now.  Today he tells me he is doing well he does not have any complaints at this time.  He is excited by his new grandson.   Past Medical History:  Diagnosis Date   Cervical spondylosis without myelopathy 09/12/2017   Dyslipidemia (high LDL; low HDL) 12/18/2019   Hypertension    Obesity (BMI 30-39.9) 12/18/2019    Past Surgical History:  Procedure Laterality Date   CERVICAL DISC SURGERY  2014   C 1-3 Fusion    Current Medications: Current Meds  Medication Sig   amLODipine (NORVASC) 10 MG tablet TAKE 1 TABLET(10 MG) BY MOUTH DAILY (Patient taking differently: Take 10 mg by mouth daily.)   valsartan (DIOVAN) 160 MG tablet Take 1 tablet (160 mg total) by mouth daily.   [DISCONTINUED] lisinopril (ZESTRIL) 20 MG tablet Take 1 tablet (20 mg total) by mouth 2 (two) times daily.     Allergies:   Patient has no known allergies.   Social History   Socioeconomic History   Marital status: Married    Spouse name: Not on file   Number of children: Not on file   Years of education: Not on file   Highest education level: Not on file  Occupational History   Not on file  Tobacco Use   Smoking status: Every Day    Types: Cigars   Smokeless tobacco: Never    Tobacco comments:    3 cigars/day  Substance and Sexual Activity   Alcohol use: Not on file   Drug use: Not on file   Sexual activity: Not on file  Other Topics Concern   Not on file  Social History Narrative   Not on file   Social Determinants of Health   Financial Resource Strain: Not on file  Food Insecurity: Not on file  Transportation Needs: Not on file  Physical Activity: Not on file  Stress: Not on file  Social Connections: Not on file     Family History: The patient's family history includes Heart attack in his sister; Hypertension in his brother and mother.  ROS:   Review of Systems  Constitution: Negative for decreased appetite, fever and weight gain.  HENT: Negative for congestion, ear discharge, hoarse voice and sore throat.   Eyes: Negative for discharge, redness, vision loss in right eye and visual halos.  Cardiovascular: Negative for chest pain, dyspnea on exertion, leg swelling, orthopnea and palpitations.  Respiratory: Negative for cough, hemoptysis, shortness of breath and snoring.   Endocrine: Negative for heat intolerance and polyphagia.  Hematologic/Lymphatic: Negative for bleeding problem. Does not bruise/bleed easily.  Skin: Negative for flushing, nail changes, rash and suspicious lesions.  Musculoskeletal: Negative for arthritis, joint  pain, muscle cramps, myalgias, neck pain and stiffness.  Gastrointestinal: Negative for abdominal pain, bowel incontinence, diarrhea and excessive appetite.  Genitourinary: Negative for decreased libido, genital sores and incomplete emptying.  Neurological: Negative for brief paralysis, focal weakness, headaches and loss of balance.  Psychiatric/Behavioral: Negative for altered mental status, depression and suicidal ideas.  Allergic/Immunologic: Negative for HIV exposure and persistent infections.    EKGs/Labs/Other Studies Reviewed:    The following studies were reviewed today:   EKG:  The ekg ordered today  demonstrates sinus rhythm, heart rate 70s beats per minute with arrhythmia.  Recent Labs: No results found for requested labs within last 8760 hours.  Recent Lipid Panel    Component Value Date/Time   CHOL 177 09/17/2019 0957   TRIG 124 09/17/2019 0957   HDL 26 (L) 09/17/2019 0957   CHOLHDL 6.8 (H) 09/17/2019 0957   LDLCALC 128 (H) 09/17/2019 0957    Physical Exam:    VS:  BP (!) 142/92 (BP Location: Left Arm, Patient Position: Sitting)   Pulse 81   Ht 6' (1.829 m)   Wt 219 lb 12.8 oz (99.7 kg)   SpO2 96%   BMI 29.81 kg/m     Wt Readings from Last 3 Encounters:  06/16/21 219 lb 12.8 oz (99.7 kg)  12/18/19 224 lb (101.6 kg)  09/17/19 214 lb (97.1 kg)     GEN: Well nourished, well developed in no acute distress HEENT: Normal NECK: No JVD; No carotid bruits LYMPHATICS: No lymphadenopathy CARDIAC: S1S2 noted,RRR, no murmurs, rubs, gallops RESPIRATORY:  Clear to auscultation without rales, wheezing or rhonchi  ABDOMEN: Soft, non-tender, non-distended, +bowel sounds, no guarding. EXTREMITIES: No edema, No cyanosis, no clubbing MUSCULOSKELETAL:  No deformity  SKIN: Warm and dry NEUROLOGIC:  Alert and oriented x 3, non-focal PSYCHIATRIC:  Normal affect, good insight  ASSESSMENT:    1. Hypertension, unspecified type   2. Dyslipidemia (high LDL; low HDL)    PLAN:     1.  His blood pressure is elevated in the office today.  Manually taken by me 160/96 mmHg.  I am going to stop the lisinopril and started patient on valsartan 160 mg daily.  He will continue on the amlodipine 10 mg daily. He will come back in 2 weeks for nurse visit. In terms of his dyslipidemia blood work will be done today to get the lipid profile. Will get blood work also for [employer] and mag. The patient understands the need to lose weight with diet and exercise. We have discussed specific strategies for this.  The patient is in agreement with the above plan. The patient left the office in stable condition.   The patient will follow up in 12 months or sooner if needed.   Medication Adjustments/Labs and Tests Ordered: Current medicines are reviewed at length with the patient today.  Concerns regarding medicines are outlined above.  Orders Placed This Encounter  Procedures   Basic metabolic panel   Magnesium   Lipid panel   EKG 12-Lead   Meds ordered this encounter  Medications   valsartan (DIOVAN) 160 MG tablet    Sig: Take 1 tablet (160 mg total) by mouth daily.    Dispense:  90 tablet    Refill:  3    Patient Instructions  Medication Instructions:  Your physician has recommended you make the following change in your medication:  STOP: Lisinopril START: Valsartan 160 mg once daily 2 week nurse visit to check blood pressure.  *If you need a refill  on your cardiac medications before your next appointment, please call your pharmacy*   Lab Work: Your physician recommends that you return for lab work in:  TODAY: BMET, Mag, Lipids If you have labs (blood work) drawn today and your tests are completely normal, you will receive your results only by: MyChart Message (if you have MyChart) OR A paper copy in the mail If you have any lab test that is abnormal or we need to change your treatment, we will call you to review the results.   Testing/Procedures: None   Follow-Up: At [facility], you and your health needs are our priority.  As part of our continuing mission to provide you with exceptional heart care, we have created designated Provider Care Teams.  These Care Teams include your primary Cardiologist (physician) and Advanced Practice Providers (APPs -  Physician Assistants and Nurse Practitioners) who all work together to provide you with the care you need, when you need it.  We recommend signing up for the patient portal called "MyChart".  Sign up information is provided on this After Visit Summary.  MyChart is used to connect with patients for Virtual Visits (Telemedicine).   Patients are able to view lab/test results, encounter notes, upcoming appointments, etc.  Non-urgent messages can be sent to your provider as well.   To learn more about what you can do with MyChart, go to [email or web address].    Your next appointment:   1 year(s)  The format for your next appointment:   In Person  Provider:   Elease Hashimoto - Thomasene Ripple, DO [address], [place], [place] [address]    Other Instructions    Adopting a Healthy Lifestyle.  Know what a healthy weight is for you (roughly BMI <25) and aim to maintain this   Aim for 7+ servings of fruits and vegetables daily   65-80+ fluid ounces of water or unsweet tea for healthy kidneys   Limit to max 1 drink of alcohol per day; avoid smoking/tobacco   Limit animal fats in diet for cholesterol and heart health - choose grass fed whenever available   Avoid highly processed foods, and foods high in saturated/trans fats   Aim for low stress - take time to unwind and care for your mental health   Aim for 150 min of moderate intensity exercise weekly for heart health, and weights twice weekly for bone health   Aim for 7-9 hours of sleep daily   When it comes to diets, agreement about the perfect plan isnt easy to find, even among the experts. Experts at the [facility] [employer] developed an idea known as the Healthy Eating Plate. Just imagine a plate divided into logical, healthy portions.   The emphasis is on diet quality:   Load up on vegetables and fruits - one-half of your plate: Aim for color and variety, and remember that potatoes dont count.   Go for whole grains - one-quarter of your plate: Whole wheat, barley, wheat berries, quinoa, oats, brown rice, and foods made with them. If you want pasta, go with whole wheat pasta.   Protein power - one-quarter of your plate: Fish, chicken, beans, and nuts are all healthy, versatile protein sources. Limit red meat.   The diet, however, does  go beyond the plate, offering a few other suggestions.   Use healthy plant oils, such as olive, canola, soy, corn, sunflower and peanut. Check the labels, and avoid partially hydrogenated oil, which have unhealthy trans fats.  If youre thirsty, drink water. Coffee and tea are good in moderation, but skip sugary drinks and limit milk and dairy products to one or two daily servings.   The type of carbohydrate in the diet is more important than the amount. Some sources of carbohydrates, such as vegetables, fruits, whole grains, and beans-are healthier than others.   Finally, stay active  Signed, Thomasene Ripple, DO  06/16/2021 10:56 AM    [facility]

## 2021-06-16 NOTE — Patient Instructions (Signed)
Medication Instructions:  Your physician has recommended you make the following change in your medication:  STOP: Lisinopril START: Valsartan 160 mg once daily 2 week nurse visit to check blood pressure.  *If you need a refill on your cardiac medications before your next appointment, please call your pharmacy*   Lab Work: Your physician recommends that you return for lab work in:  TODAY: BMET, Mag, Lipids If you have labs (blood work) drawn today and your tests are completely normal, you will receive your results only by: MyChart Message (if you have MyChart) OR A paper copy in the mail If you have any lab test that is abnormal or we need to change your treatment, we will call you to review the results.   Testing/Procedures: None   Follow-Up: At [facility], you and your health needs are our priority.  As part of our continuing mission to provide you with exceptional heart care, we have created designated Provider Care Teams.  These Care Teams include your primary Cardiologist (physician) and Advanced Practice Providers (APPs -  Physician Assistants and Nurse Practitioners) who all work together to provide you with the care you need, when you need it.  We recommend signing up for the patient portal called "MyChart".  Sign up information is provided on this After Visit Summary.  MyChart is used to connect with patients for Virtual Visits (Telemedicine).  Patients are able to view lab/test results, encounter notes, upcoming appointments, etc.  Non-urgent messages can be sent to your provider as well.   To learn more about what you can do with MyChart, go to [email or web address].    Your next appointment:   1 year(s)  The format for your next appointment:   In Person  Provider:   Elease Hashimoto - Thomasene Ripple, DO [address], [place], [place] [address]    Other Instructions

## 2021-06-16 NOTE — Progress Notes (Signed)
Ekg

## 2021-06-18 ENCOUNTER — Other Ambulatory Visit: Payer: Self-pay

## 2021-06-18 MED ORDER — ROSUVASTATIN CALCIUM 10 MG PO TABS: 10.0000 mg | ORAL_TABLET | Freq: Every day | ORAL | 3 refills | Status: DC

## 2021-06-18 NOTE — Progress Notes (Signed)
Prescription sent to pharmacy.

## 2021-06-30 ENCOUNTER — Other Ambulatory Visit: Payer: Self-pay

## 2021-06-30 ENCOUNTER — Ambulatory Visit: Payer: Managed Care, Other (non HMO)

## 2021-06-30 VITALS — BP 150/90 | HR 80 | Ht 72.0 in | Wt 217.0 lb

## 2021-06-30 DIAGNOSIS — I10 Essential (primary) hypertension: Secondary | ICD-10-CM

## 2021-06-30 NOTE — Progress Notes (Signed)
Reason for visit: BP check  Name of MD requesting visit: Tobb  H&P: HTN  ROS related to problem: medication change  Assessment and plan per MD: Tobb

## 2021-07-14 ENCOUNTER — Other Ambulatory Visit: Payer: Self-pay | Admitting: Cardiology

## 2021-09-21 ENCOUNTER — Other Ambulatory Visit: Payer: Self-pay | Admitting: Cardiology

## 2021-12-06 ENCOUNTER — Other Ambulatory Visit: Payer: Self-pay | Admitting: Cardiology

## 2022-02-09 ENCOUNTER — Other Ambulatory Visit: Payer: Self-pay | Admitting: Cardiology

## 2022-05-09 ENCOUNTER — Other Ambulatory Visit: Payer: Self-pay | Admitting: Cardiology

## 2022-06-16 ENCOUNTER — Other Ambulatory Visit: Payer: Self-pay | Admitting: Cardiology

## 2022-06-16 NOTE — Telephone Encounter (Signed)
Rx refill sent to pharmacy. 

## 2022-09-16 ENCOUNTER — Encounter (HOSPITAL_BASED_OUTPATIENT_CLINIC_OR_DEPARTMENT_OTHER): Payer: Self-pay | Admitting: Emergency Medicine

## 2022-09-16 ENCOUNTER — Other Ambulatory Visit: Payer: Self-pay

## 2022-09-16 ENCOUNTER — Emergency Department (HOSPITAL_BASED_OUTPATIENT_CLINIC_OR_DEPARTMENT_OTHER): Payer: Managed Care, Other (non HMO)

## 2022-09-16 ENCOUNTER — Emergency Department (HOSPITAL_BASED_OUTPATIENT_CLINIC_OR_DEPARTMENT_OTHER)
Admission: EM | Admit: 2022-09-16 | Discharge: 2022-09-16 | Disposition: A | Discharge status: Home / Self Care | Payer: Managed Care, Other (non HMO) | Attending: Emergency Medicine | Admitting: Emergency Medicine

## 2022-09-16 DIAGNOSIS — N12 Tubulo-interstitial nephritis, not specified as acute or chronic: Secondary | ICD-10-CM | POA: Diagnosis not present

## 2022-09-16 DIAGNOSIS — R109 Unspecified abdominal pain: Secondary | ICD-10-CM

## 2022-09-16 DIAGNOSIS — N2889 Other specified disorders of kidney and ureter: Secondary | ICD-10-CM | POA: Insufficient documentation

## 2022-09-16 DIAGNOSIS — I10 Essential (primary) hypertension: Secondary | ICD-10-CM | POA: Diagnosis not present

## 2022-09-16 DIAGNOSIS — Z79899 Other long term (current) drug therapy: Secondary | ICD-10-CM | POA: Diagnosis not present

## 2022-09-16 DIAGNOSIS — R300 Dysuria: Secondary | ICD-10-CM | POA: Diagnosis present

## 2022-09-16 LAB — CBC
HCT: 47.6 % (ref 39.0–52.0)
Hemoglobin: 15.7 g/dL (ref 13.0–17.0)
MCH: 30.3 pg (ref 26.0–34.0)
MCHC: 33 g/dL (ref 30.0–36.0)
MCV: 91.7 fL (ref 80.0–100.0)
Platelets: 189 10*3/uL (ref 150–400)
RBC: 5.19 MIL/uL (ref 4.22–5.81)
RDW: 13.1 % (ref 11.5–15.5)
WBC: 15.4 10*3/uL — ABNORMAL HIGH (ref 4.0–10.5)
nRBC: 0 % (ref 0.0–0.2)

## 2022-09-16 LAB — URINALYSIS, ROUTINE W REFLEX MICROSCOPIC
Bilirubin Urine: NEGATIVE
Glucose, UA: NEGATIVE mg/dL
Ketones, ur: NEGATIVE mg/dL
Leukocytes,Ua: NEGATIVE
Nitrite: NEGATIVE
Protein, ur: 30 mg/dL — AB
Specific Gravity, Urine: 1.025 (ref 1.005–1.030)
pH: 6 (ref 5.0–8.0)

## 2022-09-16 LAB — BASIC METABOLIC PANEL
Anion gap: 8 (ref 5–15)
BUN: 21 mg/dL — ABNORMAL HIGH (ref 6–20)
CO2: 25 mmol/L (ref 22–32)
Calcium: 9.3 mg/dL (ref 8.9–10.3)
Chloride: 108 mmol/L (ref 98–111)
Creatinine, Ser: 1.2 mg/dL (ref 0.61–1.24)
GFR, Estimated: >60 mL/min (ref >60)
Glucose, Bld: 109 mg/dL — ABNORMAL HIGH (ref 70–99)
Potassium: 3.3 mmol/L — ABNORMAL LOW (ref 3.5–5.1)
Sodium: 141 mmol/L (ref 135–145)

## 2022-09-16 LAB — URINALYSIS, MICROSCOPIC (REFLEX)

## 2022-09-16 MED ORDER — OXYCODONE HCL 5 MG PO TABS
5.0000 mg | ORAL_TABLET | ORAL | Status: AC
  Administered 2022-09-16: 5 mg via ORAL
  Filled 2022-09-16: qty 1

## 2022-09-16 MED ORDER — ONDANSETRON HCL 4 MG/2ML IJ SOLN
4.0000 mg | Freq: Once | INTRAMUSCULAR | Status: AC
  Administered 2022-09-16: 4 mg via INTRAVENOUS
  Filled 2022-09-16: qty 2

## 2022-09-16 MED ORDER — OXYCODONE HCL 5 MG PO TABS: 5.0000 mg | ORAL_TABLET | ORAL | 0 refills | Status: DC | PRN

## 2022-09-16 MED ORDER — SODIUM CHLORIDE 0.9 % IV SOLN
1.0000 g | INTRAVENOUS | Status: AC
  Administered 2022-09-16: 1 g via INTRAVENOUS
  Filled 2022-09-16: qty 10

## 2022-09-16 MED ORDER — KETOROLAC TROMETHAMINE 15 MG/ML IJ SOLN
15.0000 mg | Freq: Once | INTRAMUSCULAR | Status: AC
  Administered 2022-09-16: 15 mg via INTRAVENOUS
  Filled 2022-09-16: qty 1

## 2022-09-16 MED ORDER — LACTATED RINGERS IV BOLUS
1000.0000 mL | Freq: Once | INTRAVENOUS | Status: AC
  Administered 2022-09-16: 1000 mL via INTRAVENOUS

## 2022-09-16 MED ORDER — CEFDINIR 300 MG PO CAPS: 300.0000 mg | ORAL_CAPSULE | Freq: Two times a day (BID) | ORAL | 0 refills | Status: AC

## 2022-09-16 NOTE — ED Provider Notes (Signed)
[facility] EMERGENCY DEPARTMENT Provider Note   CSN: [id number] Arrival date & time: 09/16/22  [id number]     History  Chief Complaint  Patient presents with   Flank Pain    left    Anthony Cowan is a 51 y.o. male.  51 year old male presents the emergency department with flank pain.  Says that approximately week ago had left flank pain that resolved.  Several days ago says that it came back and is gotten more severe.  Has had vomiting.  Denies any fevers.  Denies any known injuries.  Movement does not seem to change the pain.  No hematuria but did have difficulty urinating last night.  Has had decreased p.o. intake as a result of this.  No history of kidney stones.  Denies any dysuria or frequency.       Home Medications Prior to Admission medications   Medication Sig Start Date End Date Taking? Authorizing Provider  cefdinir (OMNICEF) 300 MG capsule Take 1 capsule (300 mg total) by mouth 2 (two) times daily for 10 days. 09/16/22 09/26/22 Yes Rondel BatonPaterson, Dashanique Brownstein C, MD  oxyCODONE (ROXICODONE) 5 MG immediate release tablet Take 1 tablet (5 mg total) by mouth every 4 (four) hours as needed for severe pain. 09/16/22  Yes Rondel BatonPaterson, Joshwa Hemric C, MD  amLODipine (NORVASC) 10 MG tablet TAKE ONE TABLET BY MOUTH ONE TIME DAILY. ** NEED APPOINTMENT** 05/09/22   Tobb, Kardie, DO  rosuvastatin (CRESTOR) 10 MG tablet TAKE 1 TABLET(10 MG) BY MOUTH DAILY 06/16/22   Tobb, Kardie, DO  valsartan (DIOVAN) 160 MG tablet TAKE 1 TABLET(160 MG) BY MOUTH DAILY 06/16/22   Tobb, Kardie, DO      Allergies    Patient has no known allergies.    Review of Systems   Review of Systems  Physical Exam Updated Vital Signs BP (!) 151/103 (BP Location: Left Arm)   Pulse 81   Temp 98.1 F (36.7 C) (Oral)   Resp 17   Ht 6' (1.829 m)   Wt 99.8 kg   SpO2 100%   BMI 29.84 kg/m  Physical Exam Vitals and nursing note reviewed.  Constitutional:      General: He is not in acute distress.    Appearance: He is  well-developed.  HENT:     Head: Normocephalic and atraumatic.     Right Ear: External ear normal.     Left Ear: External ear normal.     Nose: Nose normal.  Eyes:     Extraocular Movements: Extraocular movements intact.     Conjunctiva/sclera: Conjunctivae normal.     Pupils: Pupils are equal, round, and reactive to light.  Cardiovascular:     Rate and Rhythm: Normal rate and regular rhythm.  Pulmonary:     Effort: Pulmonary effort is normal. No respiratory distress.  Abdominal:     General: There is no distension.     Palpations: Abdomen is soft. There is no mass.     Tenderness: There is no abdominal tenderness. There is left CVA tenderness. There is no right CVA tenderness or guarding.  Musculoskeletal:        General: No swelling.     Cervical back: Normal range of motion and neck supple.  Skin:    General: Skin is warm and dry.     Capillary Refill: Capillary refill takes less than 2 seconds.  Neurological:     Mental Status: He is alert and oriented to person, place, and time. Mental status is at baseline.  Psychiatric:        Mood and Affect: Mood normal.        Behavior: Behavior normal.     ED Results / Procedures / Treatments   Labs (all labs ordered are listed, but only abnormal results are displayed) Labs Reviewed  URINALYSIS, ROUTINE W REFLEX MICROSCOPIC - Abnormal; Notable for the following components:      Result Value   Hgb urine dipstick MODERATE (*)    Protein, ur 30 (*)    All other components within normal limits  BASIC METABOLIC PANEL - Abnormal; Notable for the following components:   Potassium 3.3 (*)    Glucose, Bld 109 (*)    BUN 21 (*)    All other components within normal limits  CBC - Abnormal; Notable for the following components:   WBC 15.4 (*)    All other components within normal limits  URINALYSIS, MICROSCOPIC (REFLEX) - Abnormal; Notable for the following components:   Bacteria, UA MANY (*)    All other components within normal  limits  URINE CULTURE    EKG EKG Interpretation  Date/Time:  Friday September 16 2022 10:42:33 EST Ventricular Rate:  68 PR Interval:  166 QRS Duration: 114 QT Interval:  391 QTC Calculation: 416 R Axis:   38 Text Interpretation: Sinus rhythm Borderline intraventricular conduction delay ST elev, probable normal early repol pattern No significant change since last tracing Confirmed by Vonita Moss [phone]) on 09/16/2022 11:04:43 AM  Radiology CT RENAL STONE STUDY  Result Date: 09/16/2022 CLINICAL DATA:  Left flank pain and dysuria x1 day EXAM: CT ABDOMEN AND PELVIS WITHOUT CONTRAST TECHNIQUE: Multidetector CT imaging of the abdomen and pelvis was performed following the standard protocol without IV contrast. RADIATION DOSE REDUCTION: This exam was performed according to the departmental dose-optimization program which includes automated exposure control, adjustment of the mA and/or kV according to patient size and/or use of iterative reconstruction technique. COMPARISON:  None Available. FINDINGS: Lower chest: No acute abnormality. Hepatobiliary: Unremarkable noncontrast enhanced appearance of the hepatic parenchyma. Gallbladder is unremarkable. No biliary ductal dilation. Pancreas: No pancreatic ductal dilation or evidence of acute inflammation. Spleen: No splenomegaly Adrenals/Urinary Tract: Hypodense 2.1 cm left adrenal nodule measures Hounsfield units of -5 compatible with a benign adrenal adenoma. No follow-up imaging is recommended. Right adrenal gland appears normal. No hydronephrosis.  No renal, ureteral or bladder calculi. Exophytic 11 mm left upper pole renal lesion on image 22/2 measures Hounsfield units of 21, greater than that expected for a simple cyst. Fluid density right lower pole renal lesion measures 16 mm on image 44/4 compatible with a cyst and considered benign requiring no independent imaging follow-up. Additional tiny bilateral renal lesions are technically too small to  accurately characterize but statistically likely to reflect cysts. Left peripelvic cysts are compatible with a benign finding requiring no follow-up. Urinary bladder is unremarkable for degree of distension. Stomach/Bowel: No radiopaque enteric contrast material was administered. Stomach is unremarkable for degree of distension. No pathologic dilation of large or small bowel. Normal appendix and terminal ileum. No evidence of acute bowel inflammation. Vascular/Lymphatic: Aortic atherosclerosis. Prominent/mildly enlarged portacaval and periportal lymph nodes measure up to 11 mm in short axis on image 19/2. Reproductive: Prostate is unremarkable. Other: No significant abdominopelvic free fluid. Musculoskeletal: No acute osseous abnormality. Multilevel degenerative changes spine. Degenerative change of the bilateral hips and SI joints. IMPRESSION: 1. No acute abnormality in the abdomen or pelvis. Specifically, no evidence of obstructive uropathy. 2. Exophytic 11 mm left upper  pole renal lesion measures density greater than that expected for a simple cyst. While this may reflect a hemorrhagic/proteinaceous cyst a small renal neoplasm is not excluded. Recommend further evaluation with nonemergent renal mass protocol CT or MRI with and without contrast preferably as an outpatient upon resolution of patient's current symptomatology when they are better able to follow commands including breath hold. 3. Prominent/mildly enlarged portacaval and periportal lymph nodes measure up to 11 mm in short axis, nonspecific but possibly reactive. 4. Benign 2.1 cm left adrenal adenoma. 5.  Aortic Atherosclerosis (ICD10-I70.0). Electronically Signed   By: Maudry Mayhew M.D.   On: 09/16/2022 10:37    Procedures Procedures   Medications Ordered in ED Medications  ketorolac (TORADOL) 15 MG/ML injection 15 mg (15 mg Intravenous Given 09/16/22 1033)  oxyCODONE (Oxy IR/ROXICODONE) immediate release tablet 5 mg (5 mg Oral Given 09/16/22  1033)  lactated ringers bolus 1,000 mL (0 mLs Intravenous Stopped 09/16/22 1150)  ondansetron (ZOFRAN) injection 4 mg (4 mg Intravenous Given 09/16/22 1035)  cefTRIAXone (ROCEPHIN) 1 g in sodium chloride 0.9 % 100 mL IVPB (0 g Intravenous Stopped 09/16/22 1402)    ED Course/ Medical Decision Making/ A&P Clinical Course as of 09/16/22 1944  Fri Sep 16, 2022  1103 CT shows left renal cyst but no evidence of kidney stone.  This will require outpatient repeat imaging. [RP]    Clinical Course User Index [RP] Rondel Baton, MD                           Medical Decision Making Amount and/or Complexity of Data Reviewed Labs: ordered. Radiology: ordered.  Risk Prescription drug management.   Anthony Cowan is a 51 y.o. male with comorbidities that complicate the patient evaluation including hypertension who presents with chief complaint of left flank pain.  This patient presents to the ED for concern of complaints listed in HPI, this involves an extensive number of treatment options, and is a complaint that carries with it a high risk of complications and morbidity. Disposition including potential need for admission considered.   Initial Ddx:  Kidney stone, pyelonephritis, AAA, MSK strain  MDM:  Feel patient has a kidney stone based on his symptoms.  Considered pyelonephritis as he is having difficulty urinating but is not having dysuria, frequency, or fever that would be suggestive of this.  We will check urinalysis for him.  With his history of hypertension does have increased risk for AAA which will be apparent on CT scan that we are obtaining for kidney stone.  If work-up is negative could be due to an MSK strain as he works as a Nutritional therapist.  Plan:  Labs Urinalysis CT stone protocol IV fluids Pain control  ED Summary/Re-evaluation:  Patient reassessed and is feeling much better after the above interventions.  CT stone protocol did not show evidence of kidney stone but did show  renal cysts.  Possible that that was the cause of his pain and he will require repeat imaging and outpatient follow-up.  Urine did show possible UTI so he was treated with IV ceftriaxone and sent home with a prescription for Omnicef to treat him in case of pyelonephritis.  Also given a short course of pain medication.  Instructed him to follow-up with his primary doctor in several days.  Dispo: DC Home. Return precautions discussed including, but not limited to, those listed in the AVS. Allowed pt time to ask questions which were answered fully  prior to dc.   Additional history obtained from spouse Records reviewed Outpatient Clinic Notes The following labs were independently interpreted: Urinalysis and show urinary tract infection I independently reviewed the following imaging with scope of interpretation limited to determining acute life threatening conditions related to emergency care: CT Abdomen/Pelvis, which revealed  no kidney stone   I personally reviewed and interpreted cardiac monitoring: normal sinus rhythm  I personally reviewed and interpreted the pt's EKG: see above for interpretation  I have reviewed the patients home medications and made adjustments as needed  Final Clinical Impression(s) / ED Diagnoses Final diagnoses:  Left flank pain  Pyelonephritis  Renal mass, left    Rx / DC Orders ED Discharge Orders          Ordered    cefdinir (OMNICEF) 300 MG capsule  2 times daily        09/16/22 1330    oxyCODONE (ROXICODONE) 5 MG immediate release tablet  Every 4 hours PRN        09/16/22 1330              Rondel Baton, MD 09/16/22 1944

## 2022-09-16 NOTE — Discharge Instructions (Signed)
Today you were seen in the emergency department for your flank pain.    In the emergency department you were found to have a cyst on your kidney as well as a urinary tract infection that may have progressed to your kidney (pyelonephritis).    At home, please take Tylenol and ibuprofen for your pain.  You may take oxycodone for any breakthrough pain that you have.  Please continue to take the [facility] that we have prescribed you to treat the urinary tract infection.    Check your MyChart online for the results of any tests that had not resulted by the time you left the emergency department.   Follow-up with your primary doctor in 2-3 days regarding your visit.  Please discuss the renal cyst with them and additional imaging that may be required.  Return immediately to the emergency department if you experience any of the following: High fevers, persistent vomiting, or any other concerning symptoms.    Thank you for visiting our Emergency Department. It was a pleasure taking care of you today.

## 2022-09-16 NOTE — ED Triage Notes (Signed)
Progressive left flank pain , dysuria x 1 day . No injury to back . No Hx kidney stone ,

## 2022-09-18 LAB — URINE CULTURE: Culture: NO GROWTH

## 2023-06-12 ENCOUNTER — Other Ambulatory Visit: Payer: Self-pay | Admitting: Cardiology

## 2023-07-24 ENCOUNTER — Other Ambulatory Visit: Payer: Self-pay | Admitting: Cardiology

## 2023-07-26 ENCOUNTER — Other Ambulatory Visit: Payer: Self-pay | Admitting: *Deleted

## 2023-07-26 MED ORDER — VALSARTAN 160 MG PO TABS: 160.0000 mg | ORAL_TABLET | Freq: Every day | ORAL | 0 refills | Status: DC

## 2023-08-08 ENCOUNTER — Telehealth: Payer: Self-pay | Admitting: Cardiology

## 2023-08-08 ENCOUNTER — Other Ambulatory Visit: Payer: Self-pay | Admitting: Cardiology

## 2023-08-08 NOTE — Telephone Encounter (Signed)
Ref Valsartan 160mg  #10 until pt sees [name], NP on 08-15-23

## 2023-08-08 NOTE — Telephone Encounter (Signed)
*  STAT* If patient is at the pharmacy, call can be transferred to refill team.   1. Which medications need to be refilled? (please list name of each medication and dose if known) valsartan (DIOVAN) 160 MG tablet   2. Which pharmacy/location (including street and city if local pharmacy) is medication to be sent to?  [facility] - [place], [place] - [address]      3. Do they need a 30 day or 90 day supply? 30 day    Pt is out of medication and is scheduled for office visit on 10/15.

## 2023-08-08 NOTE — Telephone Encounter (Signed)
This is a [place] pt being seen 08/15/23 in [place]. Please address

## 2023-08-14 NOTE — Progress Notes (Unsigned)
Cardiology Office Note:  .   Date:  08/15/2023  ID:  Anthony Cowan, DOB 1971/10/09, MRN [id number] PCP: Patient, No Pcp Per   [facility] Providers Cardiologist:  Marlyn Corporal Madireddy, MD    History of Present Illness: .   Anthony Cowan is a 52 y.o. male with a past medical history of hypertension, obesity and dyslipidemia.  11/04/2019 echo EF 60 to 65%, mildly increased LVH, grade 1 DD, no valvular abnormalities  Most recently evaluated by Dr. Servando Salina on 06/16/2021, he was doing well from a cardiac perspective but his blood pressure remained elevated.  His lisinopril was stopped and he was started on valsartan and advised to continue his amlodipine.  He presents today for follow-up of his hypertension after not being seen for greater than 2 years.  His blood pressure is elevated in the office today at 170/98 however he only took his blood pressure medicine approximately 30 minutes ago.  He does not check his blood pressure at home traditionally.  He stays very physically active at work, offers no formal complaints. He denies chest pain, palpitations, dyspnea, pnd, orthopnea, n, v, dizziness, syncope, edema, weight gain, or early satiety.     ROS: Review of Systems  All other systems reviewed and are negative.    Studies Reviewed: .        Cardiac Studies & Procedures       ECHOCARDIOGRAM  ECHOCARDIOGRAM COMPLETE 11/04/2019  Narrative ECHOCARDIOGRAM REPORT    Patient Name:   Anthony Cowan Date of Exam: 11/04/2019 Medical Rec #:  [id number]     Height:       72.0 in Accession #:    [id number]    Weight:       214.0 lb Date of Birth:  05-28-1971    BSA:          2.19 m Patient Age:    48 years      BP:           176/98 mmHg Patient Gender: M             HR:           68 bpm. Exam Location:  [place]  Procedure: 2D Echo  Indications:    Hypertension, unspecified type [I10] - Primary  History:        Patient has no prior history of Echocardiogram  examinations.  Sonographer:    Louie Boston Referring Phys: [id number] KARDIE TOBB  IMPRESSIONS   1. Left ventricular ejection fraction, by visual estimation, is 60 to 65%. The left ventricle has normal function. There is mildly increased left ventricular hypertrophy. 2. Left ventricular diastolic parameters are consistent with Grade I diastolic dysfunction (impaired relaxation). 3. Prominent moderator band. 4. Left atrial size was normal. 5. Right atrial size was normal.  FINDINGS Left Ventricle: Left ventricular ejection fraction, by visual estimation, is 60 to 65%. The left ventricle has normal function. The left ventricle has no regional wall motion abnormalities. There is mildly increased left ventricular hypertrophy. Left ventricular diastolic parameters are consistent with Grade I diastolic dysfunction (impaired relaxation). Normal left atrial pressure.  Right Ventricle: The right ventricular size is normal. No increase in right ventricular wall thickness. Global RV systolic function is has normal systolic function. The tricuspid regurgitant velocity is 2.50 m/s, and with an assumed right atrial pressure of 3 mmHg, the estimated right ventricular systolic pressure is normal at 28.1 mmHg. Prominent moderator band.  Left Atrium: Left atrial size was normal  in size.  Right Atrium: Right atrial size was normal in size  Pericardium: There is no evidence of pericardial effusion.  Mitral Valve: The mitral valve is normal in structure. No evidence of mitral valve regurgitation. No evidence of mitral valve stenosis by observation.  Tricuspid Valve: The tricuspid valve is normal in structure. Tricuspid valve regurgitation is not demonstrated.  Aortic Valve: The aortic valve is normal in structure. Aortic valve regurgitation is not visualized. The aortic valve is structurally normal, with no evidence of sclerosis or stenosis.  Pulmonic Valve: The pulmonic valve was normal in structure.  Pulmonic valve regurgitation is not visualized. Pulmonic regurgitation is not visualized.  Aorta: The aortic root, ascending aorta and aortic arch are all structurally normal, with no evidence of dilitation or obstruction.  Venous: The inferior vena cava is normal in size with greater than 50% respiratory variability, suggesting right atrial pressure of 3 mmHg.  IAS/Shunts: No atrial level shunt detected by color flow Doppler. There is no evidence of a patent foramen ovale. No ventricular septal defect is seen or detected. There is no evidence of an atrial septal defect.   LEFT VENTRICLE PLAX 2D LVIDd:         5.20 cm Diastology LVIDs:         3.00 cm LV e' lateral:   8.38 cm/s LV PW:         1.30 cm LV E/e' lateral: 7.0 LV IVS:        1.30 cm LV e' medial:    6.64 cm/s LV SV:         95 ml   LV E/e' medial:  8.8 LV SV Index:   42.18   RIGHT VENTRICLE             IVC RV S prime:     11.50 cm/s  IVC diam: 1.40 cm TAPSE (M-mode): 2.2 cm  LEFT ATRIUM             Index       RIGHT ATRIUM           Index LA diam:        4.50 cm 2.05 cm/m  RA Area:     19.80 cm LA Vol (A2C):   61.8 ml 28.18 ml/m RA Volume:   58.60 ml  26.72 ml/m LA Vol (A4C):   58.3 ml 26.59 ml/m LA Biplane Vol: 64.1 ml 29.23 ml/m AORTIC VALVE LVOT Vmax:   88.70 cm/s LVOT Vmean:  64.100 cm/s LVOT VTI:    0.185 m  AORTA Ao Root diam: 3.60 cm Ao Asc diam:  3.30 cm  MITRAL VALVE                        TRICUSPID VALVE MV Area (PHT): 3.48 cm             TR Peak grad:   25.1 mmHg MV PHT:        63.22 msec           TR Vmax:        280.00 cm/s MV Decel Time: 218 msec MV E velocity: 58.70 cm/s 103 cm/s  SHUNTS MV A velocity: 76.60 cm/s 70.3 cm/s Systemic VTI: 0.18 m MV E/A ratio:  0.77       1.5   Belva Crome MD Electronically signed by Belva Crome MD Signature Date/Time: 11/04/2019/1:38:34 PM    Final  Risk Assessment/Calculations:     HYPERTENSION CONTROL Vitals:   08/15/23 0804  08/15/23 0943  BP: (!) 170/98 (!) 160/96    The patient's blood pressure is elevated above target today.  In order to address the patient's elevated BP: Blood pressure will be monitored at home to determine if medication changes need to be made.          Physical Exam:   VS:  BP (!) 160/96   Pulse 70   Ht 6' (1.829 m)   Wt 223 lb 6.4 oz (101.3 kg)   SpO2 98%   BMI 30.30 kg/m    Wt Readings from Last 3 Encounters:  08/15/23 223 lb 6.4 oz (101.3 kg)  09/16/22 220 lb (99.8 kg)  06/30/21 217 lb (98.4 kg)    GEN: Well nourished, well developed in no acute distress NECK: No JVD; No carotid bruits CARDIAC: RRR, no murmurs, rubs, gallops RESPIRATORY:  Clear to auscultation without rales, wheezing or rhonchi  ABDOMEN: Soft, non-tender, non-distended EXTREMITIES:  No edema; No deformity   ASSESSMENT AND PLAN: .   Hypertension-blood pressure is elevated 170/98 however, he took his antihypertensives approximately 30 minutes ago.  Will provide him with a blood pressure log and have him check his blood pressure x 2 weeks.  For now, continue Norvasc 10 mg daily, continue Diovan 160 mg daily.  Dyslipidemia- LDL elevated at 143 on 07/19/2021, will repeat LFTs and FLP today.  Would prefer his LDL be less than 100.      Dispo: Keep a blood pressure log x 2 weeks, CMET and FLP today, return in 3 months.  Signed, Flossie Dibble, NP

## 2023-08-15 ENCOUNTER — Ambulatory Visit: Payer: PRIVATE HEALTH INSURANCE | Attending: Cardiology | Admitting: Cardiology

## 2023-08-15 ENCOUNTER — Encounter: Payer: Self-pay | Admitting: Cardiology

## 2023-08-15 VITALS — BP 160/96 | HR 70 | Ht 72.0 in | Wt 223.4 lb

## 2023-08-15 DIAGNOSIS — I10 Essential (primary) hypertension: Secondary | ICD-10-CM

## 2023-08-15 DIAGNOSIS — E785 Hyperlipidemia, unspecified: Secondary | ICD-10-CM | POA: Diagnosis not present

## 2023-08-15 NOTE — Patient Instructions (Addendum)
Medication Instructions:  Your physician recommends that you continue on your current medications as directed. Please refer to the Current Medication list given to you today.  *If you need a refill on your cardiac medications before your next appointment, please call your pharmacy*   Lab Work: Your physician recommends that you return for lab work in: Today for CMP and Fasting Lipid Panel  If you have labs (blood work) drawn today and your tests are completely normal, you will receive your results only by: MyChart Message (if you have MyChart) OR A paper copy in the mail If you have any lab test that is abnormal or we need to change your treatment, we will call you to review the results.   Testing/Procedures: NONE   Follow-Up: At [facility], you and your health needs are our priority.  As part of our continuing mission to provide you with exceptional heart care, we have created designated Provider Care Teams.  These Care Teams include your primary Cardiologist (physician) and Advanced Practice Providers (APPs -  Physician Assistants and Nurse Practitioners) who all work together to provide you with the care you need, when you need it.  We recommend signing up for the patient portal called "MyChart".  Sign up information is provided on this After Visit Summary.  MyChart is used to connect with patients for Virtual Visits (Telemedicine).  Patients are able to view lab/test results, encounter notes, upcoming appointments, etc.  Non-urgent messages can be sent to your provider as well.   To learn more about what you can do with MyChart, go to [email or web address].    Your next appointment:   3 month(s)  Provider:   [name], NP [facility])    Other Instructions Check and record BP for 2 weeks and bring log by office

## 2023-08-16 LAB — COMPREHENSIVE METABOLIC PANEL WITH GFR
ALT: 15 IU/L (ref 0–44)
AST: 16 IU/L (ref 0–40)
Albumin: 4.2 g/dL (ref 3.8–4.9)
Alkaline Phosphatase: 77 IU/L (ref 44–121)
BUN/Creatinine Ratio: 14 (ref 9–20)
BUN: 17 mg/dL (ref 6–24)
Bilirubin Total: 0.5 mg/dL (ref 0.0–1.2)
CO2: 25 mmol/L (ref 20–29)
Calcium: 9.1 mg/dL (ref 8.7–10.2)
Chloride: 104 mmol/L (ref 96–106)
Creatinine, Ser: 1.22 mg/dL (ref 0.76–1.27)
Globulin, Total: 2.3 g/dL (ref 1.5–4.5)
Glucose: 101 mg/dL — ABNORMAL HIGH (ref 70–99)
Potassium: 3.4 mmol/L — ABNORMAL LOW (ref 3.5–5.2)
Sodium: 140 mmol/L (ref 134–144)
Total Protein: 6.5 g/dL (ref 6.0–8.5)
eGFR: 71 mL/min/1.73

## 2023-08-16 LAB — LIPID PANEL
Chol/HDL Ratio: 5.8 ratio — ABNORMAL HIGH (ref 0.0–5.0)
Cholesterol, Total: 151 mg/dL (ref 100–199)
HDL: 26 mg/dL — ABNORMAL LOW
LDL Chol Calc (NIH): 106 mg/dL — ABNORMAL HIGH (ref 0–99)
Triglycerides: 99 mg/dL (ref 0–149)
VLDL Cholesterol Cal: 19 mg/dL (ref 5–40)

## 2023-08-18 ENCOUNTER — Other Ambulatory Visit: Payer: Self-pay

## 2023-08-18 ENCOUNTER — Telehealth: Payer: Self-pay | Admitting: Cardiology

## 2023-08-18 ENCOUNTER — Telehealth: Payer: Self-pay | Admitting: *Deleted

## 2023-08-18 DIAGNOSIS — E785 Hyperlipidemia, unspecified: Secondary | ICD-10-CM

## 2023-08-18 MED ORDER — AMLODIPINE BESYLATE 10 MG PO TABS: 10.0000 mg | ORAL_TABLET | Freq: Every day | ORAL | 3 refills | Status: DC

## 2023-08-18 MED ORDER — ROSUVASTATIN CALCIUM 20 MG PO TABS: 20.0000 mg | ORAL_TABLET | Freq: Every day | ORAL | 3 refills | Status: DC

## 2023-08-18 NOTE — Telephone Encounter (Signed)
Left message for pt to call back about lab work results.

## 2023-08-18 NOTE — Telephone Encounter (Signed)
-----   Message from Flossie Dibble sent at 08/16/2023  6:12 PM EDT ----- Bad cholesterol is too high, increase Crestor to 20 mg daily, repeat FLP and LFTs in 8 weeks.   Good cholesterol is too low, increase intentional physical activity (walking, gym).   Other labs look ok.

## 2023-08-18 NOTE — Telephone Encounter (Signed)
Patient returned staff call regarding results.

## 2023-08-25 NOTE — Telephone Encounter (Signed)
Left message for pt to call back about labs

## 2023-09-25 ENCOUNTER — Other Ambulatory Visit: Payer: Self-pay

## 2023-09-25 ENCOUNTER — Telehealth: Payer: Self-pay | Admitting: Cardiology

## 2023-09-25 MED ORDER — AMLODIPINE BESYLATE 10 MG PO TABS: 10.0000 mg | ORAL_TABLET | Freq: Every day | ORAL | 3 refills | Status: AC

## 2023-09-25 MED ORDER — VALSARTAN 160 MG PO TABS: 160.0000 mg | ORAL_TABLET | Freq: Every day | ORAL | 3 refills | Status: DC

## 2023-09-25 MED ORDER — ROSUVASTATIN CALCIUM 20 MG PO TABS: 20.0000 mg | ORAL_TABLET | Freq: Every day | ORAL | 3 refills | Status: AC

## 2023-09-25 NOTE — Telephone Encounter (Signed)
Pt's wife states that the pt's after visit summary does not match what the pt is taking. Please advise

## 2023-09-25 NOTE — Telephone Encounter (Signed)
Norvasc 10, Crestor 20mg  and Valsartan 160mg  sent to [facility] pharmacy per pt request.

## 2023-12-25 NOTE — Progress Notes (Unsigned)
 Cardiology Office Note:  .   Date:  12/26/2023  ID:  Anthony Cowan, DOB 03-12-1971, MRN [id number] PCP: Patient, No Pcp Per  [facility] Providers Cardiologist:  Marlyn Corporal Madireddy, MD    History of Present Illness: .   Anthony Cowan is a 53 y.o. male with a past medical history of hypertension, obesity and dyslipidemia.  11/04/2019 echo EF 60 to 65%, mildly increased LVH, grade 1 DD, no valvular abnormalities  Evaluated by Dr. Servando Salina on 06/16/2021, he was doing well from a cardiac perspective but his blood pressure remained elevated.  His lisinopril was stopped and he was started on valsartan and advised to continue his amlodipine.  Evaluated on 08/15/2023 for follow-up of his hypertension, his blood pressure was elevated at 170/98, we instructed him to keep a blood pressure log for 2 weeks and follow-up in 3 months.      He presents today for follow-up of his hypertension, unfortunately his blood pressure still elevated.  He did not finish keeping a log of his blood pressure readings.  He checks them sporadically at home and notices they are also persistently elevated.  He is asymptomatic from this perspective.  He stays busy, works as a Nutritional therapist. He denies chest pain, palpitations, dyspnea, pnd, orthopnea, n, v, dizziness, syncope, edema, weight gain, or early satiety.    ROS: Review of Systems  All other systems reviewed and are negative.    Studies Reviewed: Marland Kitchen   EKG Interpretation Date/Time:  Tuesday December 26 2023 09:15:49 EST Ventricular Rate:  67 PR Interval:  166 QRS Duration:  104 QT Interval:  404 QTC Calculation: 426 R Axis:   47  Text Interpretation: Normal sinus rhythm Normal ECG When compared with ECG of 16-Sep-2022 10:42, PREVIOUS ECG IS PRESENT Confirmed by [name] [phone]) on 12/26/2023 9:19:36 AM    Cardiac Studies & Procedures   ______________________________________________________________________________________________      ECHOCARDIOGRAM  ECHOCARDIOGRAM COMPLETE 11/04/2019  Narrative ECHOCARDIOGRAM REPORT    Patient Name:   Anthony Cowan Date of Exam: 11/04/2019 Medical Rec #:  [id number]     Height:       72.0 in Accession #:    [id number]    Weight:       214.0 lb Date of Birth:  June 23, 1971    BSA:          2.19 m Patient Age:    48 years      BP:           176/98 mmHg Patient Gender: M             HR:           68 bpm. Exam Location:  [place]  Procedure: 2D Echo  Indications:    Hypertension, unspecified type [I10] - Primary  History:        Patient has no prior history of Echocardiogram examinations.  Sonographer:    Louie Boston Referring Phys: [id number] KARDIE TOBB  IMPRESSIONS   1. Left ventricular ejection fraction, by visual estimation, is 60 to 65%. The left ventricle has normal function. There is mildly increased left ventricular hypertrophy. 2. Left ventricular diastolic parameters are consistent with Grade I diastolic dysfunction (impaired relaxation). 3. Prominent moderator band. 4. Left atrial size was normal. 5. Right atrial size was normal.  FINDINGS Left Ventricle: Left ventricular ejection fraction, by visual estimation, is 60 to 65%. The left ventricle has normal function. The left ventricle has no regional wall motion abnormalities. There  is mildly increased left ventricular hypertrophy. Left ventricular diastolic parameters are consistent with Grade I diastolic dysfunction (impaired relaxation). Normal left atrial pressure.  Right Ventricle: The right ventricular size is normal. No increase in right ventricular wall thickness. Global RV systolic function is has normal systolic function. The tricuspid regurgitant velocity is 2.50 m/s, and with an assumed right atrial pressure of 3 mmHg, the estimated right ventricular systolic pressure is normal at 28.1 mmHg. Prominent moderator band.  Left Atrium: Left atrial size was normal in size.  Right Atrium: Right atrial size was  normal in size  Pericardium: There is no evidence of pericardial effusion.  Mitral Valve: The mitral valve is normal in structure. No evidence of mitral valve regurgitation. No evidence of mitral valve stenosis by observation.  Tricuspid Valve: The tricuspid valve is normal in structure. Tricuspid valve regurgitation is not demonstrated.  Aortic Valve: The aortic valve is normal in structure. Aortic valve regurgitation is not visualized. The aortic valve is structurally normal, with no evidence of sclerosis or stenosis.  Pulmonic Valve: The pulmonic valve was normal in structure. Pulmonic valve regurgitation is not visualized. Pulmonic regurgitation is not visualized.  Aorta: The aortic root, ascending aorta and aortic arch are all structurally normal, with no evidence of dilitation or obstruction.  Venous: The inferior vena cava is normal in size with greater than 50% respiratory variability, suggesting right atrial pressure of 3 mmHg.  IAS/Shunts: No atrial level shunt detected by color flow Doppler. There is no evidence of a patent foramen ovale. No ventricular septal defect is seen or detected. There is no evidence of an atrial septal defect.   LEFT VENTRICLE PLAX 2D LVIDd:         5.20 cm Diastology LVIDs:         3.00 cm LV e' lateral:   8.38 cm/s LV PW:         1.30 cm LV E/e' lateral: 7.0 LV IVS:        1.30 cm LV e' medial:    6.64 cm/s LV SV:         95 ml   LV E/e' medial:  8.8 LV SV Index:   42.18   RIGHT VENTRICLE             IVC RV S prime:     11.50 cm/s  IVC diam: 1.40 cm TAPSE (M-mode): 2.2 cm  LEFT ATRIUM             Index       RIGHT ATRIUM           Index LA diam:        4.50 cm 2.05 cm/m  RA Area:     19.80 cm LA Vol (A2C):   61.8 ml 28.18 ml/m RA Volume:   58.60 ml  26.72 ml/m LA Vol (A4C):   58.3 ml 26.59 ml/m LA Biplane Vol: 64.1 ml 29.23 ml/m AORTIC VALVE LVOT Vmax:   88.70 cm/s LVOT Vmean:  64.100 cm/s LVOT VTI:    0.185 m  AORTA Ao Root  diam: 3.60 cm Ao Asc diam:  3.30 cm  MITRAL VALVE                        TRICUSPID VALVE MV Area (PHT): 3.48 cm             TR Peak grad:   25.1 mmHg MV PHT:        63.22 msec  TR Vmax:        280.00 cm/s MV Decel Time: 218 msec MV E velocity: 58.70 cm/s 103 cm/s  SHUNTS MV A velocity: 76.60 cm/s 70.3 cm/s Systemic VTI: 0.18 m MV E/A ratio:  0.77       1.5   Belva Crome MD Electronically signed by Belva Crome MD Signature Date/Time: 11/04/2019/1:38:34 PM    Final          ______________________________________________________________________________________________      Risk Assessment/Calculations:     HYPERTENSION CONTROL Vitals:   12/26/23 0907 12/26/23 0955  BP: (!) 180/100 (!) 168/98    The patient's blood pressure is elevated above target today.  In order to address the patient's elevated BP: Labs and/or other diagnostics are currently pending prior to making blood pressure medication adjustments.          Physical Exam:   VS:  BP (!) 168/98   Pulse 67   Ht 6' (1.829 m)   Wt 220 lb (99.8 kg)   SpO2 97%   BMI 29.84 kg/m    Wt Readings from Last 3 Encounters:  12/26/23 220 lb (99.8 kg)  08/15/23 223 lb 6.4 oz (101.3 kg)  09/16/22 220 lb (99.8 kg)    GEN: Well nourished, well developed in no acute distress NECK: No JVD; No carotid bruits CARDIAC: RRR, no murmurs, rubs, gallops RESPIRATORY:  Clear to auscultation without rales, wheezing or rhonchi  ABDOMEN: Soft, non-tender, non-distended EXTREMITIES:  No edema; No deformity   ASSESSMENT AND PLAN: .   Hypertension-blood pressure is elevated 168/96 however, has been persistently elevated.  Continue Norvasc 10 mg daily, continue valsartan 160 mg daily--will repeat BMET and if his kidney function is acceptable we will increase his valsartan to 320 mg daily.  Will have him return in a few weeks for nurse visit to recheck his blood pressure.  Dyslipidemia-will check LFTs and direct LDL  today.      Dispo: CMET, direct LDL, plan to increase his valsartan to 320 mg daily after lab work.  Follow-up based on return visit with nurse and blood pressure check.  Signed, Flossie Dibble, NP

## 2023-12-26 ENCOUNTER — Ambulatory Visit: Payer: PRIVATE HEALTH INSURANCE | Attending: Cardiology | Admitting: Cardiology

## 2023-12-26 ENCOUNTER — Encounter: Payer: Self-pay | Admitting: Cardiology

## 2023-12-26 VITALS — BP 168/98 | HR 67 | Ht 72.0 in | Wt 220.0 lb

## 2023-12-26 DIAGNOSIS — E785 Hyperlipidemia, unspecified: Secondary | ICD-10-CM

## 2023-12-26 DIAGNOSIS — I10 Essential (primary) hypertension: Secondary | ICD-10-CM

## 2023-12-26 NOTE — Patient Instructions (Signed)
 Medication Instructions:  Your physician recommends that you continue on your current medications as directed. Please refer to the Current Medication list given to you today.  *If you need a refill on your cardiac medications before your next appointment, please call your pharmacy*   Lab Work: CMP, Direct LDL- today If you have labs (blood work) drawn today and your tests are completely normal, you will receive your results only by: MyChart Message (if you have MyChart) OR A paper copy in the mail If you have any lab test that is abnormal or we need to change your treatment, we will call you to review the results.   Testing/Procedures: None Ordered   Follow-Up: At [facility], you and your health needs are our priority.  As part of our continuing mission to provide you with exceptional heart care, we have created designated Provider Care Teams.  These Care Teams include your primary Cardiologist (physician) and Advanced Practice Providers (APPs -  Physician Assistants and Nurse Practitioners) who all work together to provide you with the care you need, when you need it.  We recommend signing up for the patient portal called "MyChart".  Sign up information is provided on this After Visit Summary.  MyChart is used to connect with patients for Virtual Visits (Telemedicine).  Patients are able to view lab/test results, encounter notes, upcoming appointments, etc.  Non-urgent messages can be sent to your provider as well.   To learn more about what you can do with MyChart, go to [email or web address].    Your next appointment:   Based on Nurse Visit  The format for your next appointment:   In Person  Provider:   [name], NP   Other Instructions Nurse visit in 2-3 weeks for BP check

## 2023-12-27 LAB — COMPREHENSIVE METABOLIC PANEL WITH GFR
ALT: 16 IU/L (ref 0–44)
AST: 15 IU/L (ref 0–40)
Albumin: 4.3 g/dL (ref 3.8–4.9)
Alkaline Phosphatase: 67 IU/L (ref 44–121)
BUN/Creatinine Ratio: 15 (ref 9–20)
BUN: 17 mg/dL (ref 6–24)
Bilirubin Total: 0.6 mg/dL (ref 0.0–1.2)
CO2: 24 mmol/L (ref 20–29)
Calcium: 8.9 mg/dL (ref 8.7–10.2)
Chloride: 105 mmol/L (ref 96–106)
Creatinine, Ser: 1.11 mg/dL (ref 0.76–1.27)
Globulin, Total: 2.5 g/dL (ref 1.5–4.5)
Glucose: 113 mg/dL — ABNORMAL HIGH (ref 70–99)
Potassium: 3.9 mmol/L (ref 3.5–5.2)
Sodium: 144 mmol/L (ref 134–144)
Total Protein: 6.8 g/dL (ref 6.0–8.5)
eGFR: 79 mL/min/1.73

## 2023-12-27 LAB — LDL CHOLESTEROL, DIRECT: LDL Direct: 73 mg/dL (ref 0–99)

## 2023-12-28 ENCOUNTER — Telehealth: Payer: Self-pay | Admitting: Emergency Medicine

## 2023-12-28 DIAGNOSIS — E785 Hyperlipidemia, unspecified: Secondary | ICD-10-CM

## 2023-12-28 DIAGNOSIS — I10 Essential (primary) hypertension: Secondary | ICD-10-CM

## 2023-12-28 MED ORDER — VALSARTAN 320 MG PO TABS: 320.0000 mg | ORAL_TABLET | Freq: Every day | ORAL | 1 refills | Status: DC

## 2023-12-28 NOTE — Telephone Encounter (Signed)
 Results reviewed with pt as per [name] NP's note.  Pt verbalized understanding and had no additional questions. Rx sent to pharmacy. Pt has nurse visit for 01/09/2024 at 9:00 am  Routed to PCP.

## 2023-12-28 NOTE — Telephone Encounter (Signed)
-----   Message from Flossie Dibble sent at 12/27/2023  2:50 PM EST ----- Labs look good.   Increase valsartan to 320 mg daily.  Return for nurse visit for BP check in a few weeks and we need a BMET at that time too.   Cholesterol is fine.

## 2024-01-09 ENCOUNTER — Ambulatory Visit: Payer: PRIVATE HEALTH INSURANCE | Attending: Cardiology

## 2024-01-09 VITALS — BP 170/108 | HR 82 | Ht 72.0 in | Wt 224.6 lb

## 2024-01-09 DIAGNOSIS — I10 Essential (primary) hypertension: Secondary | ICD-10-CM

## 2024-01-09 MED ORDER — TELMISARTAN-HCTZ 80-25 MG PO TABS: 1.0000 | ORAL_TABLET | Freq: Every day | ORAL | 1 refills | Status: DC

## 2024-01-09 NOTE — Progress Notes (Unsigned)
   Nurse Visit   Date of Encounter: 01/09/2024 ID: Nanda Quinton, DOB [birth year], MRN [id number]  PCP:  Patient, No Pcp Per   [facility] Providers Cardiologist:  Marlyn Corporal Madireddy, MD { Click to update primary MD,subspecialty MD or APP then REFRESH:1}     Visit Details   VS:  BP (!) 170/108 (BP Location: Right Arm, Patient Position: Sitting)   Pulse 82   Ht 6' (1.829 m)   Wt 224 lb 9.6 oz (101.9 kg)   SpO2 97%   BMI 30.46 kg/m  , BMI Body mass index is 30.46 kg/m.  Wt Readings from Last 3 Encounters:  01/09/24 224 lb 9.6 oz (101.9 kg)  12/26/23 220 lb (99.8 kg)  08/15/23 223 lb 6.4 oz (101.3 kg)     Reason for visit: BP re check per [name], NP Performed today: Vitals Changes (medications, testing, etc.) : Stop Valsartan. Start Telmisartan/hydrochlorothiazide 80/25 q d. Repeat BMP and Nurse visit for BP check in 2 weeks.  Length of Visit: 15 minutes    Medications Adjustments/Labs and Tests Ordered: No orders of the defined types were placed in this encounter.  No orders of the defined types were placed in this encounter.    Signed, Neena Rhymes, RN  01/09/2024 9:16 AM

## 2024-01-23 ENCOUNTER — Ambulatory Visit: Payer: PRIVATE HEALTH INSURANCE | Attending: Cardiology

## 2024-01-23 VITALS — BP 170/110 | HR 76 | Resp 18 | Ht 72.0 in | Wt 221.8 lb

## 2024-01-23 DIAGNOSIS — I10 Essential (primary) hypertension: Secondary | ICD-10-CM

## 2024-01-23 MED ORDER — METOPROLOL SUCCINATE ER 50 MG PO TB24: 25.0000 mg | ORAL_TABLET | Freq: Every day | ORAL | 3 refills | Status: DC

## 2024-01-23 NOTE — Patient Instructions (Addendum)
 Medication Instructions:  Your physician has recommended you make the following change in your medication:   Start Toprol XL 50 mg daily in addition to your other medications. Keep a BP log of checking your BP 3 times daily.  *If you need a refill on your cardiac medications before your next appointment, please call your pharmacy*   Lab Work: None  If you have labs (blood work) drawn today and your tests are completely normal, you will receive your results only by: MyChart Message (if you have MyChart) OR A paper copy in the mail If you have any lab test that is abnormal or we need to change your treatment, we will call you to review the results.   Testing/Procedures: None ordered   Follow-Up: At [facility], you and your health needs are our priority.  As part of our continuing mission to provide you with exceptional heart care, we have created designated Provider Care Teams.  These Care Teams include your primary Cardiologist (physician) and Advanced Practice Providers (APPs -  Physician Assistants and Nurse Practitioners) who all work together to provide you with the care you need, when you need it.  We recommend signing up for the patient portal called "MyChart".  Sign up information is provided on this After Visit Summary.  MyChart is used to connect with patients for Virtual Visits (Telemedicine).  Patients are able to view lab/test results, encounter notes, upcoming appointments, etc.  Non-urgent messages can be sent to your provider as well.   To learn more about what you can do with MyChart, go to [email or web address].    Your next appointment:   1 week(s)  The format for your next appointment:   In Person  Provider:   Nurse visit for vital sign recheck and labs. Added Toprol XL 50 mg daily. Review BP log.     Other Instructions none  Important Information About Sugar

## 2024-01-23 NOTE — Progress Notes (Signed)
   Nurse Visit   Date of Encounter: 01/23/2024 ID: Nanda Quinton, DOB 1971-09-06, MRN [id number]  PCP:  Patient, No Pcp Per   [facility] Providers Cardiologist:  Marlyn Corporal Madireddy, MD      Visit Details   VS:  BP (!) 170/110 (BP Location: Right Arm, Patient Position: Sitting, Cuff Size: Normal)   Pulse 76   Resp 18   Ht 6' (1.829 m)   Wt 221 lb 12.8 oz (100.6 kg)   SpO2 99%   BMI 30.08 kg/m  , BMI Body mass index is 30.08 kg/m.  Wt Readings from Last 3 Encounters:  01/23/24 221 lb 12.8 oz (100.6 kg)  01/09/24 224 lb 9.6 oz (101.9 kg)  12/26/23 220 lb (99.8 kg)     Reason for visit: BP recheck Performed today: Vitals, Provider consulted:Revankar, and Education Changes (medications, testing, etc.) : Add Toprol XL 50 mg daily and keep a BP log check BP 3 times daily Length of Visit: 20 minutes    Medications Adjustments/Labs and Tests Ordered: No orders of the defined types were placed in this encounter.  Meds ordered this encounter  Medications   metoprolol succinate (TOPROL XL) 50 MG 24 hr tablet    Sig: Take 0.5 tablets (25 mg total) by mouth daily.    Dispense:  90 tablet    Refill:  3     Signed, Eleonore Chiquito, RN  01/23/2024 9:23 AM

## 2024-01-24 LAB — BASIC METABOLIC PANEL WITH GFR
BUN/Creatinine Ratio: 13 (ref 9–20)
BUN: 16 mg/dL (ref 6–24)
CO2: 22 mmol/L (ref 20–29)
Calcium: 9.4 mg/dL (ref 8.7–10.2)
Chloride: 102 mmol/L (ref 96–106)
Creatinine, Ser: 1.23 mg/dL (ref 0.76–1.27)
Glucose: 128 mg/dL — ABNORMAL HIGH (ref 70–99)
Potassium: 3.3 mmol/L — ABNORMAL LOW (ref 3.5–5.2)
Sodium: 141 mmol/L (ref 134–144)
eGFR: 70 mL/min/1.73

## 2024-01-25 ENCOUNTER — Telehealth: Payer: Self-pay | Admitting: Emergency Medicine

## 2024-01-25 NOTE — Telephone Encounter (Signed)
 Called and spoke to patient and reviewed lab results with patient as per [facility] note. Patient verbalized understanding and had no further questions.

## 2024-01-25 NOTE — Telephone Encounter (Signed)
-----   Message from Flossie Dibble sent at 01/24/2024  9:46 AM EDT ----- Repeat labs look good.  Potassium low normal, increase potassium intake from foods.

## 2024-01-30 ENCOUNTER — Ambulatory Visit: Payer: PRIVATE HEALTH INSURANCE | Attending: Cardiology | Admitting: Emergency Medicine

## 2024-01-30 NOTE — Progress Notes (Signed)
   Nurse Visit   Date of Encounter: 01/30/2024 ID: Anthony Cowan, DOB 10-Mar-1971, MRN [id number]  PCP:  Patient, Anthony Cowan   [facility] Providers Cardiologist:  Anthony Corporal Madireddy, MD      Visit Details   VS:  BP 138/82   Pulse 70   Ht 6' (1.829 m)   Wt 224 lb (101.6 kg)   BMI 30.38 kg/m  , BMI Body mass index is 30.38 kg/m.  Wt Readings from Last 3 Encounters:  01/30/24 224 lb (101.6 kg)  01/23/24 221 lb 12.8 oz (100.6 kg)  01/09/24 224 lb 9.6 oz (101.9 kg)     Reason for visit: Blood pressure check  Performed today: Vital sign check, [name], NP reviewed blood pressure log.  Changes (medications, testing, etc.) : Anthony changes Length of Visit: 20 minutes    Medications Adjustments/Labs and Tests Ordered: Anthony orders of the defined types were placed in this encounter.  Anthony orders of the defined types were placed in this encounter.    Signed, Lonia Farber, RN  01/30/2024 9:08 AM

## 2024-02-12 ENCOUNTER — Other Ambulatory Visit: Payer: Self-pay

## 2024-02-12 ENCOUNTER — Telehealth: Payer: Self-pay | Admitting: Cardiology

## 2024-02-12 ENCOUNTER — Telehealth: Payer: Self-pay

## 2024-02-12 MED ORDER — METOPROLOL SUCCINATE ER 50 MG PO TB24: 50.0000 mg | ORAL_TABLET | Freq: Every day | ORAL | 3 refills | Status: DC

## 2024-02-12 MED ORDER — METOPROLOL SUCCINATE ER 50 MG PO TB24: 50.0000 mg | ORAL_TABLET | Freq: Every day | ORAL | 0 refills | Status: DC

## 2024-02-12 MED ORDER — METOPROLOL SUCCINATE ER 50 MG PO TB24: 50.0000 mg | ORAL_TABLET | Freq: Every day | ORAL | 3 refills | Status: AC

## 2024-02-12 NOTE — Addendum Note (Signed)
 Addended by: Einar Grave on: 02/12/2024 02:02 PM   Modules accepted: Orders

## 2024-02-12 NOTE — Telephone Encounter (Signed)
 Advised to take 50 mg tablet as his BP has been normal.

## 2024-02-12 NOTE — Telephone Encounter (Signed)
 90 day sent to [facility]

## 2024-02-12 NOTE — Telephone Encounter (Signed)
 Pt c/o medication issue:  1. Name of Medication:   metoprolol succinate (TOPROL XL) 50 MG 24 hr tablet    2. How are you currently taking this medication (dosage and times per day)? As written   3. Are you having a reaction (difficulty breathing--STAT)? No   4. What is your medication issue? Pt spouse called in stating pt has been taking one whole tablet instead of 0.5. He is out of medication and pharmacy will not fill. She states pt bp has been a little higher than usual. Please advise.

## 2024-03-18 ENCOUNTER — Other Ambulatory Visit: Payer: Self-pay | Admitting: Cardiology

## 2024-04-01 ENCOUNTER — Other Ambulatory Visit: Payer: Self-pay | Admitting: Pharmacist

## 2024-04-01 ENCOUNTER — Other Ambulatory Visit: Payer: Self-pay

## 2024-04-01 DIAGNOSIS — I10 Essential (primary) hypertension: Secondary | ICD-10-CM

## 2024-04-01 MED ORDER — TELMISARTAN-HCTZ 80-25 MG PO TABS: 1.0000 | ORAL_TABLET | Freq: Every day | ORAL | 2 refills | Status: AC

## 2024-04-01 MED ORDER — TELMISARTAN-HCTZ 80-25 MG PO TABS: 1.0000 | ORAL_TABLET | Freq: Every day | ORAL | 1 refills | Status: DC

## 2024-04-01 NOTE — Telephone Encounter (Signed)
 Called and spoke with patient about medication. Patient stated he wasn't sure why it was only refilled for a month. I stated that it was refilled for a month with 1 refill. I also told him that I will refill him enough to last a year because I can see that he was seen by a provider within a year and medication was not discontinued. I asked patient if he would like the medication to be sent to the same pharmacy and he stated yes. Medication was sent and and I called the pharmacy that was not desired and had it cancelled.

## 2024-04-01 NOTE — Telephone Encounter (Signed)
 Wife is calling to check the status of refill. Pt has been out of medication
